# Patient Record
Sex: Male | Born: 1960 | Race: White | Hispanic: No | Marital: Married | Smoking: Former smoker
Health system: Southern US, Community
[De-identification: ages and names within clinical notes are randomized; demographics above are authoritative.]

## PROBLEM LIST (undated history)

## (undated) DIAGNOSIS — N2889 Other specified disorders of kidney and ureter: Secondary | ICD-10-CM

## (undated) DIAGNOSIS — C679 Malignant neoplasm of bladder, unspecified: Secondary | ICD-10-CM

## (undated) HISTORY — DX: Malignant neoplasm of bladder, unspecified: C67.9

## (undated) HISTORY — DX: Other specified disorders of kidney and ureter: N28.89

---
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
## 2014-12-11 HISTORY — PX: COLONOSCOPY: SHX174

## 2017-12-11 HISTORY — PX: BLADDER TUMOR EXCISION: SHX238

## 2018-09-25 NOTE — Progress Notes (Signed)
Christopher Salinas DOB: [birth year] Encounter date: 09/26/2018  This is a 57 y.o. male who presents to establish care. Chief Complaint  Patient presents with  . New Patient (Initial Visit)    chronic cough, x 1 year, worsened over the past 2 weeks, dry cough,     History of present illness: Trying to get out of present county for healthcare.  Had grandchildren after first of year; one got flu and him and wife ended up with cough. Has resolved somewhat, but in last few weeks has worsened. More productive cough, increased drainage.   Has had same doctor for a long time and has had bump in road with provider and just wasn't happy. Had some blood in urine in May; had been having blood in urine for a year; just occasionally light pink. Previous doc thought kidney stone. Then one morning got up and had large quantity of frank blood when he went to urinate. Tried to make it through morning Systems developer, Sunday school, meeting). Ended up getting CT scan, cystoscopy and bladder CA was discovered July 19th this year. Has to go back to specialist for eval of something on kidney that was seen on imaging on right. Had polyps removed in July; intrabladder treatment (chemo). Re scoped past weds and found some scar tissue which was removed/cauterized and has plans for repeat cystoscopy in January.  2 years ago wife had cerebral hemorrhage and spent significant time in hospital. Just more medical issues between two of them that they have been managing in last couple of years.   Cough has always been dry, but in last couple of weeks he is coughing up some phlegm. Does not feel SOB or wheezy at all. Knows that bp medication could trigger, but has not tried other medications. BP is typically in 371'G systolic. Has not had any lung imaging.   No fevers.   Past Medical History:  Diagnosis Date  . Bladder cancer ([place])   . Renal mass    Past Surgical History:  Procedure Laterality Date  . BLADDER TUMOR EXCISION  2019   . COLONOSCOPY  2016  . OPEN ANTERIOR SHOULDER RECONSTRUCTION Right 1980   No Known Allergies Current Meds  Medication Sig  . tadalafil (CIALIS) 5 MG tablet Take 5 mg by mouth as needed for erectile dysfunction.  . [DISCONTINUED] lisinopril-hydrochlorothiazide (PRINZIDE,ZESTORETIC) 20-12.5 MG tablet Take 1 tablet by mouth daily. 1/2 tablet daily   Social History   Tobacco Use  . Smoking status: Former Smoker    Types: Cigars, Cigarettes  . Smokeless tobacco: Never Used  Substance Use Topics  . Alcohol use: Not Currently   Family History  Problem Relation Age of Onset  . High Cholesterol Mother   . High blood pressure Mother   . Cancer Father        melanoma  . High blood pressure Father   . High blood pressure Sister   . High blood pressure Maternal Grandmother   . Kidney disease Maternal Grandmother   . Lung cancer Paternal Grandmother        smoker  . COPD Paternal Grandmother   . Lung cancer Paternal Grandfather        smoker  . High blood pressure Son      Review of Systems  Constitutional: Negative for chills, fatigue and fever.  HENT: Negative for sinus pressure (better with neti pot). Congestion: minimal.   Respiratory: Positive for cough. Negative for chest tightness, shortness of breath and wheezing.   Cardiovascular: Negative  for chest pain, palpitations and leg swelling.  Gastrointestinal:       Hemorrhoids are ongoing issue. Flare up frequently and bother him significantly.  Genitourinary: Negative for difficulty urinating, dysuria and flank pain.  Musculoskeletal: Negative for arthralgias and back pain.    Objective:  BP 130/84 (BP Location: Left Arm, Patient Position: Sitting, Cuff Size: Normal)   Pulse 79   Temp 97.9 F (36.6 C) (Oral)   Ht 6\' 1"  (1.854 m)   Wt 217 lb 11.2 oz (98.7 kg)   SpO2 97%   BMI 28.72 kg/m   Weight: 217 lb 11.2 oz (98.7 kg)   BP Readings from Last 3 Encounters:  09/26/18 130/84   Wt Readings from Last 3 Encounters:   09/26/18 217 lb 11.2 oz (98.7 kg)    Physical Exam  Constitutional: He is oriented to person, place, and time. He appears well-developed and well-nourished. No distress.  HENT:  Mouth/Throat: Uvula is midline. Posterior oropharyngeal edema and posterior oropharyngeal erythema present. No oropharyngeal exudate.  cobblestoning  Cardiovascular: Normal rate, regular rhythm and normal heart sounds. Exam reveals no friction rub.  No murmur heard. No lower extremity edema  Pulmonary/Chest: Effort normal. No respiratory distress. He has decreased breath sounds (slightly diffuse). He has no wheezes. He has no rales.  Abdominal: Soft. Normal appearance and bowel sounds are normal. There is no tenderness.  Neurological: He is alert and oriented to person, place, and time.  Psychiatric: His behavior is normal. Cognition and memory are normal.    Assessment/Plan: 1. Cough Will start with depo and duoneb given in office today. Tx with abx due to longevity of cough with worsened symptoms and heavy post nasal drainage. CXR due to prolonged cough. Consider more advanced imaging if cough not resolving. Suspect lisinopril may be trigger; see below. - DG Chest 2 View; Future - methylPREDNISolone acetate (DEPO-MEDROL) injection 80 mg  2. Post-nasal drainage See above. - amoxicillin-clavulanate (AUGMENTIN) 875-125 MG tablet; Take 1 tablet by mouth 2 (two) times daily.  Dispense: 20 tablet; Refill: 0  3. Smoking history  - ipratropium-albuterol (DUONEB) 0.5-2.5 (3) MG/3ML nebulizer solution 3 mL  4. Hypertension, unspecified type Stable on half dose lisinopril-hctz. Will change to losartan lower dose and encouraged to continue to monitor at home. I suspect we can control with single medication. - losartan (COZAAR) 25 MG tablet; Take 1 tablet (25 mg total) by mouth daily.  Dispense: 30 tablet; Refill: 2  5. Hemorrhoids, unspecified hemorrhoid type anusol prn; discussed limiting time on toilet;  toileting as soon as there is urge.  - Ambulatory referral to Colorectal Surgery  Return pending CXR.  Micheline Rough, MD  Record request has been signed; will review once available.

## 2018-09-26 ENCOUNTER — Ambulatory Visit (INDEPENDENT_AMBULATORY_CARE_PROVIDER_SITE_OTHER): Payer: 59

## 2018-09-26 ENCOUNTER — Encounter: Payer: Self-pay | Admitting: Family Medicine

## 2018-09-26 ENCOUNTER — Ambulatory Visit (INDEPENDENT_AMBULATORY_CARE_PROVIDER_SITE_OTHER): Payer: 59 | Admitting: Family Medicine

## 2018-09-26 VITALS — BP 130/84 | HR 79 | Temp 97.9°F | Ht 73.0 in | Wt 217.7 lb

## 2018-09-26 DIAGNOSIS — Z87891 Personal history of nicotine dependence: Secondary | ICD-10-CM

## 2018-09-26 DIAGNOSIS — R05 Cough: Secondary | ICD-10-CM | POA: Diagnosis not present

## 2018-09-26 DIAGNOSIS — I10 Essential (primary) hypertension: Secondary | ICD-10-CM

## 2018-09-26 DIAGNOSIS — R059 Cough, unspecified: Secondary | ICD-10-CM

## 2018-09-26 DIAGNOSIS — R0982 Postnasal drip: Secondary | ICD-10-CM

## 2018-09-26 DIAGNOSIS — K649 Unspecified hemorrhoids: Secondary | ICD-10-CM

## 2018-09-26 MED ORDER — LOSARTAN POTASSIUM-HCTZ 50-12.5 MG PO TABS: 1.0000 | ORAL_TABLET | Freq: Every day | ORAL | 3 refills | Status: DC

## 2018-09-26 MED ORDER — METHYLPREDNISOLONE ACETATE 80 MG/ML IJ SUSP
80.0000 mg | Freq: Once | INTRAMUSCULAR | Status: AC
  Administered 2018-09-26: 80 mg via INTRAMUSCULAR

## 2018-09-26 MED ORDER — IPRATROPIUM-ALBUTEROL 0.5-2.5 (3) MG/3ML IN SOLN
3.0000 mL | Freq: Once | RESPIRATORY_TRACT | Status: AC
  Administered 2018-09-26: 3 mL via RESPIRATORY_TRACT

## 2018-09-26 MED ORDER — HYDROCORTISONE 2.5 % RE CREA: 1.0000 "application " | TOPICAL_CREAM | Freq: Two times a day (BID) | RECTAL | 2 refills | Status: DC

## 2018-09-26 MED ORDER — AMOXICILLIN-POT CLAVULANATE 875-125 MG PO TABS: 1.0000 | ORAL_TABLET | Freq: Two times a day (BID) | ORAL | 0 refills | Status: DC

## 2018-09-26 MED ORDER — LOSARTAN POTASSIUM 25 MG PO TABS: 25.0000 mg | ORAL_TABLET | Freq: Every day | ORAL | 2 refills | Status: DC

## 2018-09-26 NOTE — Patient Instructions (Signed)
Chest xray when you are able.   Let me know if any worsening of symptoms or if cough is not resolved in 2 weeks time.

## 2018-09-27 ENCOUNTER — Encounter: Payer: Self-pay | Admitting: Family Medicine

## 2018-10-10 ENCOUNTER — Telehealth: Payer: Self-pay | Admitting: *Deleted

## 2018-10-10 NOTE — Telephone Encounter (Signed)
Copied from [place] [id number]. Topic: General - Other >> Oct 10, 2018 11:06 AM Oneta Rack wrote: Relation to pt: self  Call back [phone] Pharmacy: [facility], [address] [phone] (Phone) [phone] (Fax)    Reason for call:  Patient was last seen 09/26/18 by Dr. Micheline Rough as per patient AVS "Let me know if any worsening of symptoms or if cough is not resolved in 2 weeks time" patient states cough improved slightly but not 100% seeking clinical advice. Patient also wanted to inform you he scheduled his physical for 12/16/17 with PCP

## 2018-10-10 NOTE — Telephone Encounter (Signed)
Please get description of cough - productive? Hacking? Any SOB/wheezing? Make sure he started on the losartan and stopped lisinopril?  I feel reassured that xray looked good when he had that and that cough has improved somewhat. I'll let him know what to do next pending response to above. Also; remind him (he might have already) to remind specialists to [place] me on notes when he sees them.

## 2018-10-11 MED ORDER — ALBUTEROL SULFATE HFA 108 (90 BASE) MCG/ACT IN AERS: INHALATION_SPRAY | RESPIRATORY_TRACT | 0 refills | Status: DC

## 2018-10-11 MED ORDER — SPACER/AERO-HOLDING CHAMBERS DEVI: 0 refills | Status: DC

## 2018-10-11 NOTE — Telephone Encounter (Signed)
Patient has been given notes per Dr. Ethlyn Gallery, stated that he has to sign a release form for specialist to send [place]  Results. He will do this Monday.

## 2018-10-11 NOTE — Telephone Encounter (Signed)
Cough is productive, Hacking, with no SOB or wheezing.  Patient has started losartan and stopped lisinopril. Reminded to have specialist CC results to Dr. Ethlyn Gallery.

## 2018-10-11 NOTE — Telephone Encounter (Signed)
Please ask him to take daily antihistamine (claritin or zyrtec) and please send in albuterol inhaler for him to use BID as well as spacing chamber. Have him give me update if any worsening of cough or if not improved in 2 weeks. I am hoping things will be cleared up at that point. If no improvement at all in 1 week contact me.

## 2018-11-10 HISTORY — PX: HEMORRHOID BANDING: SHX5850

## 2018-12-16 ENCOUNTER — Encounter: Payer: Self-pay | Admitting: Family Medicine

## 2018-12-16 ENCOUNTER — Ambulatory Visit (INDEPENDENT_AMBULATORY_CARE_PROVIDER_SITE_OTHER): Payer: 59 | Admitting: Family Medicine

## 2018-12-16 VITALS — BP 150/80 | HR 80 | Temp 98.0°F | Wt 217.6 lb

## 2018-12-16 DIAGNOSIS — D489 Neoplasm of uncertain behavior, unspecified: Secondary | ICD-10-CM

## 2018-12-16 DIAGNOSIS — Z13220 Encounter for screening for lipoid disorders: Secondary | ICD-10-CM | POA: Diagnosis not present

## 2018-12-16 DIAGNOSIS — I10 Essential (primary) hypertension: Secondary | ICD-10-CM

## 2018-12-16 DIAGNOSIS — Z23 Encounter for immunization: Secondary | ICD-10-CM

## 2018-12-16 DIAGNOSIS — Z0001 Encounter for general adult medical examination with abnormal findings: Secondary | ICD-10-CM | POA: Diagnosis not present

## 2018-12-16 DIAGNOSIS — R5383 Other fatigue: Secondary | ICD-10-CM

## 2018-12-16 DIAGNOSIS — Z0000 Encounter for general adult medical examination without abnormal findings: Secondary | ICD-10-CM

## 2018-12-16 LAB — LIPID PANEL
Cholesterol: 232 mg/dL — ABNORMAL HIGH (ref 0–200)
HDL: 35 mg/dL — ABNORMAL LOW (ref >39.00)
LDL Cholesterol: 164 mg/dL — ABNORMAL HIGH (ref 0–99)
NonHDL: 196.63
Total CHOL/HDL Ratio: 7
Triglycerides: 165 mg/dL — ABNORMAL HIGH (ref 0.0–149.0)
VLDL: 33 mg/dL (ref 0.0–40.0)

## 2018-12-16 LAB — CBC WITH DIFFERENTIAL/PLATELET
Basophils Absolute: 0 10*3/uL (ref 0.0–0.1)
Basophils Relative: 0.7 % (ref 0.0–3.0)
EOS ABS: 0.1 10*3/uL (ref 0.0–0.7)
Eosinophils Relative: 1.6 % (ref 0.0–5.0)
HCT: 43.8 % (ref 39.0–52.0)
HEMOGLOBIN: 15.2 g/dL (ref 13.0–17.0)
Lymphocytes Relative: 22.2 % (ref 12.0–46.0)
Lymphs Abs: 1.4 10*3/uL (ref 0.7–4.0)
MCHC: 34.6 g/dL (ref 30.0–36.0)
MCV: 87 fl (ref 78.0–100.0)
MONO ABS: 0.6 10*3/uL (ref 0.1–1.0)
Monocytes Relative: 9.6 % (ref 3.0–12.0)
Neutro Abs: 4.1 10*3/uL (ref 1.4–7.7)
Neutrophils Relative %: 65.9 % (ref 43.0–77.0)
Platelets: 268 10*3/uL (ref 150.0–400.0)
RBC: 5.03 Mil/uL (ref 4.22–5.81)
RDW: 12.3 % (ref 11.5–15.5)
WBC: 6.3 10*3/uL (ref 4.0–10.5)

## 2018-12-16 LAB — COMPREHENSIVE METABOLIC PANEL
ALT: 19 U/L (ref 0–53)
AST: 16 U/L (ref 0–37)
Albumin: 4.4 g/dL (ref 3.5–5.2)
Alkaline Phosphatase: 83 U/L (ref 39–117)
BUN: 13 mg/dL (ref 6–23)
CO2: 28 mEq/L (ref 19–32)
CREATININE: 0.88 mg/dL (ref 0.40–1.50)
Calcium: 9.6 mg/dL (ref 8.4–10.5)
Chloride: 102 mEq/L (ref 96–112)
GFR: 95.42 mL/min (ref >60.00)
Glucose, Bld: 103 mg/dL — ABNORMAL HIGH (ref 70–99)
Potassium: 4.4 mEq/L (ref 3.5–5.1)
Sodium: 138 mEq/L (ref 135–145)
Total Bilirubin: 0.5 mg/dL (ref 0.2–1.2)
Total Protein: 6.8 g/dL (ref 6.0–8.3)

## 2018-12-16 LAB — TSH: TSH: 0.44 u[IU]/mL (ref 0.35–4.50)

## 2018-12-16 MED ORDER — AMLODIPINE BESYLATE 2.5 MG PO TABS: 2.5000 mg | ORAL_TABLET | Freq: Every day | ORAL | 2 refills | Status: DC

## 2018-12-16 NOTE — Patient Instructions (Addendum)
Stop losartan. Start amlodpine - ok to try half tablet daily initially.   I will call you with lab and pathology results when I get them.

## 2018-12-16 NOTE — Progress Notes (Signed)
Christopher Salinas DOB: 1961-05-12 Encounter date: 12/16/2018  This is a 58 y.o. male who presents for complete physical   History of present illness/Additional concerns: ?Tdap? Not done in last 10 years to his recollection. Traveling out of country in Feb to [place].  Flu: doesn't take flu shot.  Colonoscopy last completed 2-3 years ago. It was clear. We do not have these records.  Shingrix; hasn't had this yet.  At last visit changed lisinopril -hctz to losartan 25mg . We also discussed prolonged cough. Seemed like it was getting better for a few weeks, but then started to worsen. Feels like something is crawling in back of throat. Had vomiting illness and cough actually went away for a few days with vomiting. Not feeling acid reflux symptoms, but wonders if this is related. Occasionally productive. Has been in dusty/dirty environments; working on house, works in [employer] shop so wonders if this contributes.  Had hemorrhoid banding in December. Supposed to have f/u with urology in Jan for repeat cystoscopy with renal US prior to visit. Appointment is Jan 15th with them.   ?Routine bloodwork? Haven't received previous PCP records yet. Not been doing as well as he should taking care of self. Has tried to take cholesterol medications a few times, but it made him feel badly. Doesn't recall names of medications.   Sees eye doc on regular basis; getting new glasses this week.  Sees dentist regularly.  Past Medical History:  Diagnosis Date  . Bladder cancer ([place])   . Renal mass    Past Surgical History:  Procedure Laterality Date  . BLADDER TUMOR EXCISION  2019  . COLONOSCOPY  2016  . OPEN ANTERIOR SHOULDER RECONSTRUCTION Right 1980   No Known Allergies Current Meds  Medication Sig  . albuterol (PROVENTIL HFA;VENTOLIN HFA) 108 (90 Base) MCG/ACT inhaler Inhale 2 puffs into the lungs every 6 hours as needed.  Marland Kitchen amoxicillin-clavulanate (AUGMENTIN) 875-125 MG tablet Take 1 tablet by  mouth 2 (two) times daily.  . hydrocortisone (ANUSOL-HC) 2.5 % rectal cream Place 1 application rectally 2 (two) times daily.  Marland Kitchen Spacer/Aero-Holding Chambers DEVI Inhale 2 puffs into the lungs every 6 hours as needed  . tadalafil (CIALIS) 5 MG tablet Take 5 mg by mouth as needed for erectile dysfunction.  . [DISCONTINUED] losartan (COZAAR) 25 MG tablet Take 1 tablet (25 mg total) by mouth daily.   Social History   Tobacco Use  . Smoking status: Former Smoker    Types: Cigars, Cigarettes  . Smokeless tobacco: Never Used  Substance Use Topics  . Alcohol use: Not Currently   Family History  Problem Relation Age of Onset  . High Cholesterol Mother   . High blood pressure Mother   . Cancer Father        melanoma  . High blood pressure Father   . High blood pressure Sister   . High blood pressure Maternal Grandmother   . Kidney disease Maternal Grandmother   . Lung cancer Paternal Grandmother        smoker  . COPD Paternal Grandmother   . Lung cancer Paternal Grandfather        smoker  . High blood pressure Son      Review of Systems  Constitutional: Negative for activity change, appetite change, chills, fatigue, fever and unexpected weight change.  HENT: Negative for congestion, ear pain, hearing loss, postnasal drip, rhinorrhea, sinus pressure, sinus pain, sore throat and trouble swallowing.   Eyes: Negative for pain and visual  disturbance.  Respiratory: Positive for cough. Negative for chest tightness, shortness of breath and wheezing.   Cardiovascular: Negative for chest pain, palpitations and leg swelling.  Gastrointestinal: Negative for abdominal distention, abdominal pain, blood in stool, constipation, diarrhea, nausea and vomiting.  Genitourinary: Negative for decreased urine volume, difficulty urinating, dysuria, penile pain and testicular pain.  Musculoskeletal: Negative for arthralgias, back pain and joint swelling.  Skin: Negative for rash.       Skin lesion right  cheek, itchy, present for a year. Thinks enlarging.   Neurological: Negative for dizziness, weakness, numbness and headaches.  Hematological: Negative for adenopathy. Does not bruise/bleed easily.  Psychiatric/Behavioral: Negative for agitation, sleep disturbance and suicidal ideas. The patient is not nervous/anxious.     CBC: No results found for: WBC, HGB, HCT, MCH, MCHC, RDW, PLT, MPV CMP:No results found for: NA, K, CL, CO2, ANIONGAP, GLUCOSE, BUN, CREATININE, LABGLOB, GFRAA, CALCIUM, PROT, AGRATIO, BILITOT, ALKPHOS, ALT, AST, GLOB LIPID:No results found for: CHOL, TRIG, HDL, LDLCALC, LABVLDL  Objective:  BP (!) 150/80 (BP Location: Left Arm, Patient Position: Sitting, Cuff Size: Normal)   Pulse 80   Temp 98 F (36.7 C) (Oral)   Wt 217 lb 9.6 oz (98.7 kg)   SpO2 97%   BMI 28.71 kg/m   Weight: 217 lb 9.6 oz (98.7 kg)   BP Readings from Last 3 Encounters:  12/16/18 (!) 150/80  09/26/18 130/84   Wt Readings from Last 3 Encounters:  12/16/18 217 lb 9.6 oz (98.7 kg)  09/26/18 217 lb 11.2 oz (98.7 kg)    Physical Exam Constitutional:      General: He is not in acute distress.    Appearance: He is well-developed.  HENT:     Head: Normocephalic and atraumatic.     Right Ear: External ear normal.     Left Ear: External ear normal.     Nose: Nose normal.     Mouth/Throat:     Pharynx: No oropharyngeal exudate.  Eyes:     Conjunctiva/sclera: Conjunctivae normal.     Pupils: Pupils are equal, round, and reactive to light.  Neck:     Musculoskeletal: Neck supple.     Thyroid: No thyromegaly.  Cardiovascular:     Rate and Rhythm: Normal rate and regular rhythm.     Heart sounds: Normal heart sounds. No murmur. No friction rub. No gallop.   Pulmonary:     Effort: Pulmonary effort is normal. No respiratory distress.     Breath sounds: Normal breath sounds. No stridor. No wheezing or rales.  Abdominal:     General: Bowel sounds are normal.     Palpations: Abdomen is soft.   Musculoskeletal: Normal range of motion.  Skin:    General: Skin is warm and dry.     Comments: 2 adjacent 45mm fleshy papular lesion right cheek with some surrounding scaling. Some slight increased vascularity underlying. Entire area approx 50mm.   Neurological:     Mental Status: He is alert and oriented to person, place, and time.  Psychiatric:        Behavior: Behavior normal.        Thought Content: Thought content normal.        Judgment: Judgment normal.    Shave Biopsy Procedure Note  Pre-operative Diagnosis: Suspicious lesion  Post-operative Diagnosis: same  Locations: fleshy papular lesion approx 63mm in diameter  Anesthesia: Lidocaine 1% with epinephrine   Procedure Details  Patient informed of the risks, including bleeding and infection, and benefits  of the  procedure and Verbal informed consent obtained. The lesion and surrounding area was given a sterile prep using chloraprep and draped in the usual sterile fashion. The skin lesion was shaved superficially using a dermablade.  Hemostasis of biopsy site was achieved using aluminum chloride. Sterile dressing applied. The specimen was sent for pathologic examination. The patient tolerated the procedure well.  Complications: none.  Plan: 1. Instructed to keep the wound dry and covered for 24 hrs and clean thereafter with soap and water.  But no chlorine hot tubs or pool exposure to surgical site.  2. Warning signs of infection were reviewed.   3. Recommended that the patient use OTC analgesics as needed for pain.  4. Will contact patient with pathology results when obtained.  Micheline Rough, MD   Assessment/Plan: Health Maintenance Due  Topic Date Due  . Hepatitis C Screening  1961-05-26  . HIV Screening  07/16/1978  . TETANUS/TDAP  07/16/1982  . COLONOSCOPY  07/16/2013  . INFLUENZA VACCINE  07/11/2018   Health Maintenance reviewed.  1. Preventative health care He is up to date with age recommended  screenings. Recommend daily exercise to help with overall health.  2. Lipid screening Complete today. Not tolerated statins in past. - Lipid panel; Future - Lipid panel  3. Need for hepatitis A immunization Traveling to [place]. [place] in February. Can complete series after return. - Hepatitis A vaccine adult IM  4. Need for Tdap vaccination - Tdap vaccine greater than or equal to 7yo IM  5. Other fatigue - Comprehensive metabolic panel; Future - CBC with Differential/Platelet; Future - TSH; Future - TSH - CBC with Differential/Platelet - Comprehensive metabolic panel  6. Neoplasm, uncertain whether benign or malignant Send for pathology; derm referral pending result. He prefers not to see another specialist if possible. - PR SHAV SKIN LES < 0.5 CM TRUNK,ARM,LEG - Dermatology pathology  7. Hypertension, unspecified type We are going to switch out the losartan to norvasc. Uncertain if continuing with cough due to losartan. Trial 1/2 tab of the norvasc (currently has only been doing 1/2 tab of losartan 25mg ) and return visit pending bloodwork/path but would like to see better bp control without side effects. If cough persists would recommend treating to cover allergy/reflux and monitoring for response. He has no active allergy/reflux sx and prefers to keep medications to minimum which is why we are trying new bp medication first. - amLODipine (NORVASC) 2.5 MG tablet; Take 1 tablet (2.5 mg total) by mouth daily.  Dispense: 30 tablet; Refill: 2   Return pending bloodwork.  Micheline Rough, MD

## 2018-12-17 ENCOUNTER — Other Ambulatory Visit (INDEPENDENT_AMBULATORY_CARE_PROVIDER_SITE_OTHER): Payer: 59

## 2018-12-17 DIAGNOSIS — R739 Hyperglycemia, unspecified: Secondary | ICD-10-CM

## 2018-12-17 LAB — HEMOGLOBIN A1C: Hgb A1c MFr Bld: 5.4 % (ref 4.6–6.5)

## 2018-12-18 ENCOUNTER — Encounter: Payer: Self-pay | Admitting: Family Medicine

## 2018-12-18 NOTE — Telephone Encounter (Addendum)
I see that he has had 1 Hep A vaccine on 12/16/18 Please advise

## 2019-01-16 ENCOUNTER — Other Ambulatory Visit: Payer: Self-pay | Admitting: Family Medicine

## 2019-01-16 ENCOUNTER — Encounter: Payer: Self-pay | Admitting: Family Medicine

## 2019-01-16 NOTE — Telephone Encounter (Signed)
Please advise. Does patient need a appointment?

## 2019-01-17 ENCOUNTER — Other Ambulatory Visit: Payer: Self-pay | Admitting: Family Medicine

## 2019-01-17 DIAGNOSIS — I10 Essential (primary) hypertension: Secondary | ICD-10-CM

## 2019-01-17 MED ORDER — AMLODIPINE BESYLATE 2.5 MG PO TABS: 2.5000 mg | ORAL_TABLET | Freq: Every day | ORAL | 3 refills | Status: DC

## 2019-01-17 MED ORDER — TADALAFIL 5 MG PO TABS: 5.0000 mg | ORAL_TABLET | Freq: Every day | ORAL | 5 refills | Status: DC | PRN

## 2019-03-10 ENCOUNTER — Encounter: Payer: Self-pay | Admitting: Family Medicine

## 2019-04-17 ENCOUNTER — Encounter: Payer: Self-pay | Admitting: Family Medicine

## 2019-05-21 ENCOUNTER — Encounter: Payer: Self-pay | Admitting: Family Medicine

## 2019-11-07 IMAGING — DX DG CHEST 2V
2 series · 2 of 2 positions shown · non-contrast
Comparison: None

CLINICAL DATA: Cough since [REDACTED], keeping patient up at night,
minimally productive of clear material, former smoker

EXAM:
CHEST - 2 VIEW

[chest pa]
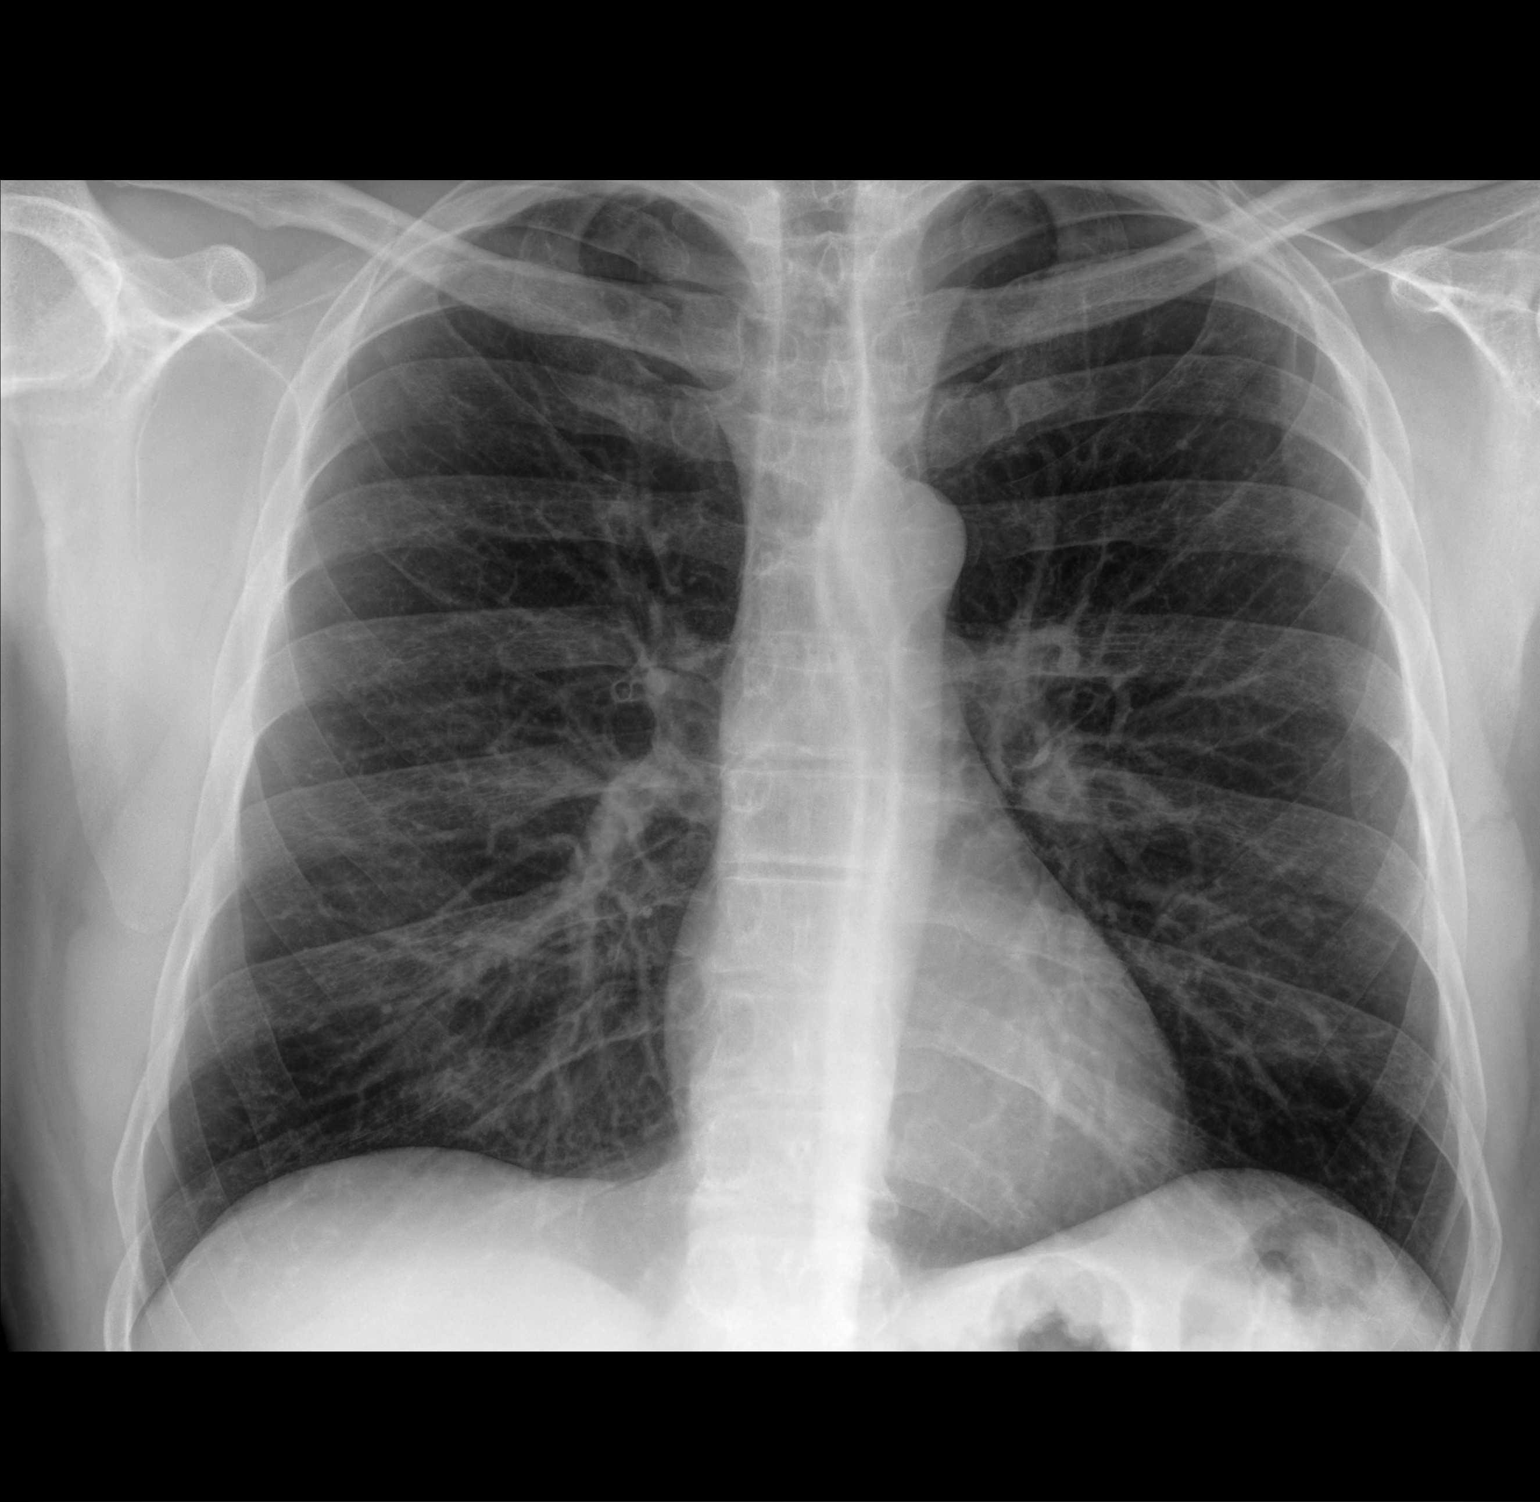

[chest lat]
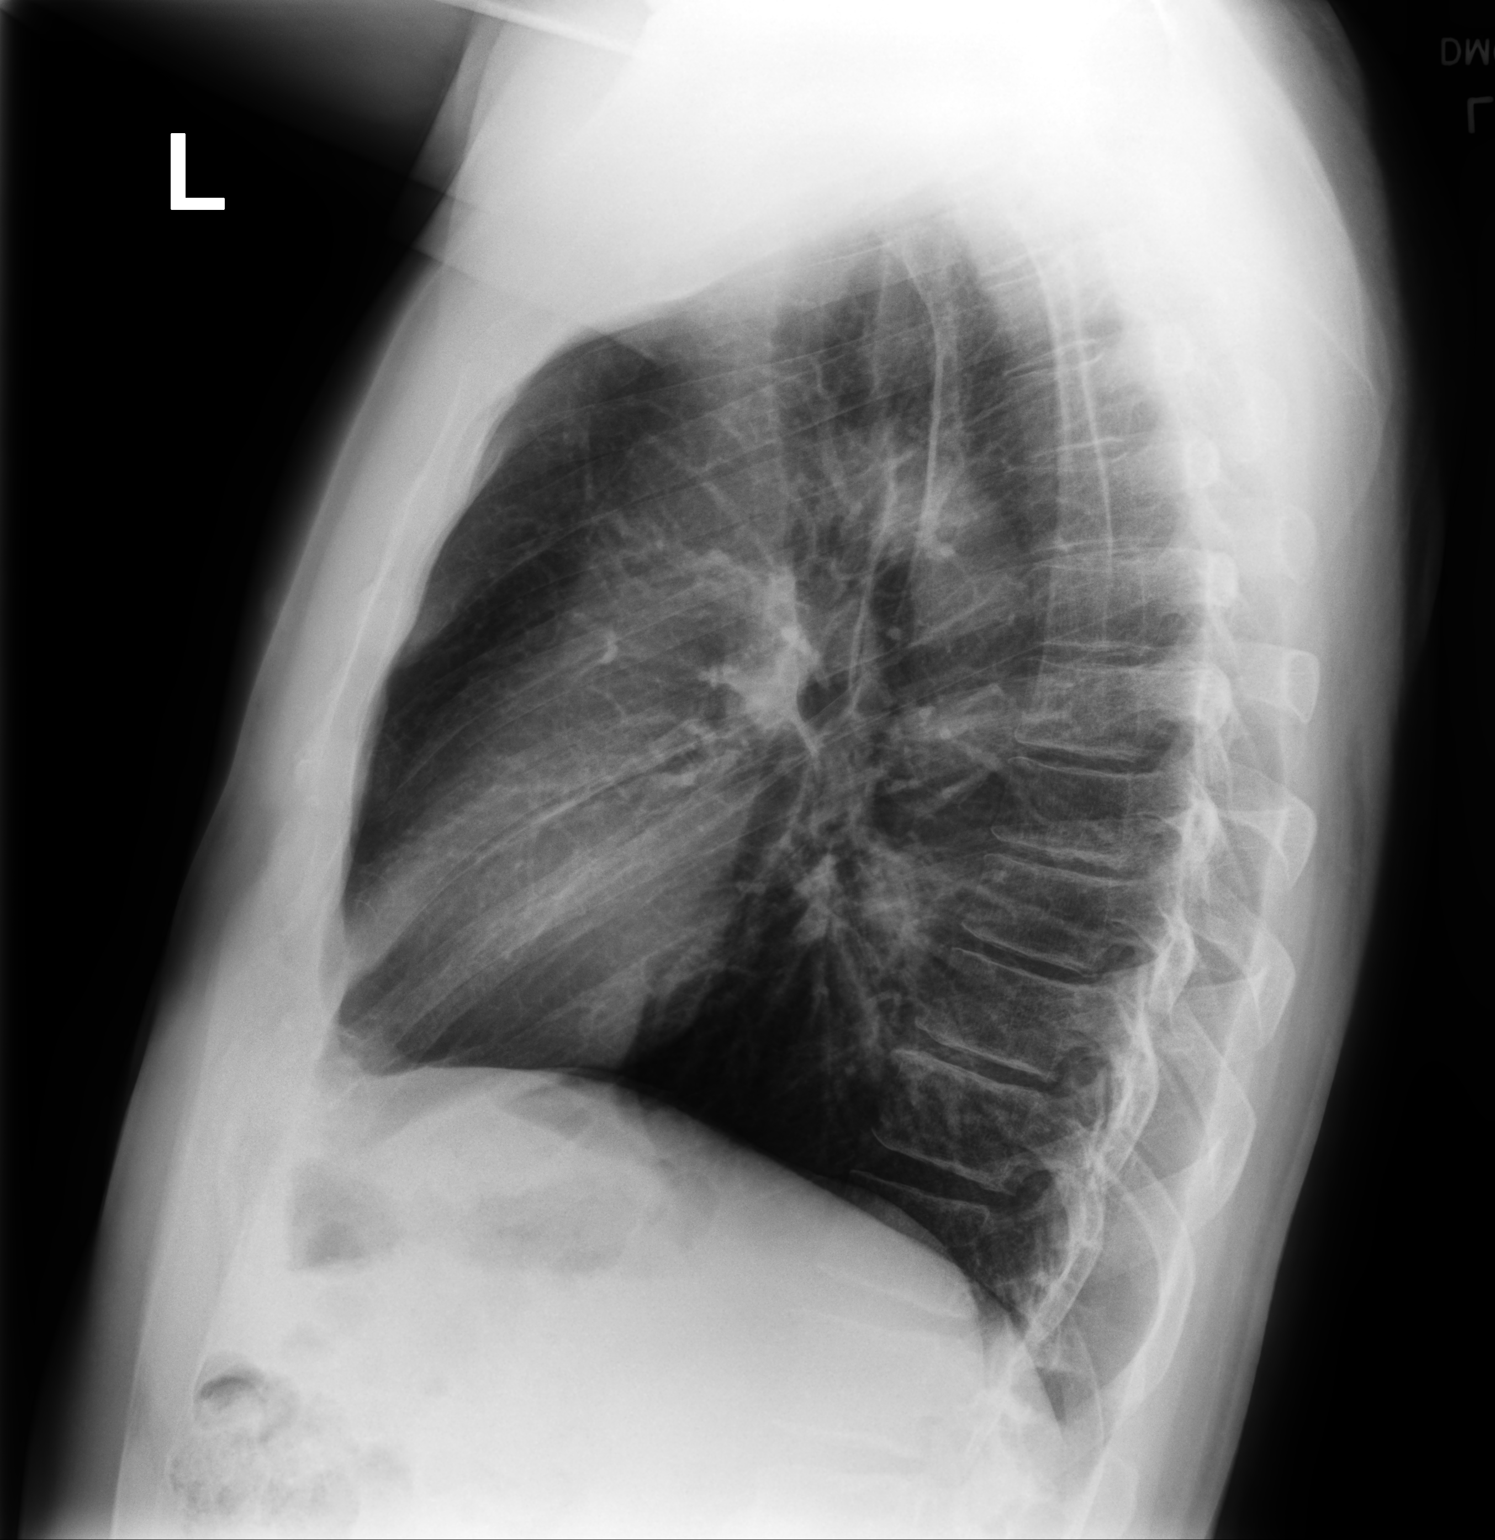

[2 of 2 positions shown; findings below may reference images not displayed]

FINDINGS: Normal heart size, mediastinal contours, and pulmonary vascularity.

Minimal central peribronchial thickening and hyperinflation.

No acute pulmonary infiltrate, pleural effusion or pneumothorax.

Bones unremarkable.
IMPRESSION: Minimal bronchitic changes and hyperinflation which could reflect
COPD or asthma.

No acute infiltrate.

## 2019-12-19 ENCOUNTER — Encounter: Payer: Self-pay | Admitting: Family Medicine

## 2019-12-24 ENCOUNTER — Encounter: Payer: Self-pay | Admitting: Family Medicine

## 2019-12-24 ENCOUNTER — Ambulatory Visit (INDEPENDENT_AMBULATORY_CARE_PROVIDER_SITE_OTHER): Payer: 59 | Admitting: Family Medicine

## 2019-12-24 ENCOUNTER — Other Ambulatory Visit: Payer: Self-pay

## 2019-12-24 VITALS — BP 150/80 | HR 91 | Temp 97.5°F | Ht 73.0 in | Wt 224.6 lb

## 2019-12-24 DIAGNOSIS — Z0000 Encounter for general adult medical examination without abnormal findings: Secondary | ICD-10-CM | POA: Diagnosis not present

## 2019-12-24 DIAGNOSIS — M2550 Pain in unspecified joint: Secondary | ICD-10-CM | POA: Diagnosis not present

## 2019-12-24 DIAGNOSIS — Z131 Encounter for screening for diabetes mellitus: Secondary | ICD-10-CM

## 2019-12-24 DIAGNOSIS — Z13220 Encounter for screening for lipoid disorders: Secondary | ICD-10-CM | POA: Diagnosis not present

## 2019-12-24 DIAGNOSIS — E049 Nontoxic goiter, unspecified: Secondary | ICD-10-CM

## 2019-12-24 DIAGNOSIS — I10 Essential (primary) hypertension: Secondary | ICD-10-CM | POA: Diagnosis not present

## 2019-12-24 DIAGNOSIS — Z8551 Personal history of malignant neoplasm of bladder: Secondary | ICD-10-CM

## 2019-12-24 DIAGNOSIS — M7711 Lateral epicondylitis, right elbow: Secondary | ICD-10-CM | POA: Diagnosis not present

## 2019-12-24 LAB — LIPID PANEL
Cholesterol: 247 mg/dL — ABNORMAL HIGH (ref 0–200)
HDL: 38.7 mg/dL — ABNORMAL LOW (ref >39.00)
LDL Cholesterol: 179 mg/dL — ABNORMAL HIGH (ref 0–99)
NonHDL: 208.67
Total CHOL/HDL Ratio: 6
Triglycerides: 148 mg/dL (ref 0.0–149.0)
VLDL: 29.6 mg/dL (ref 0.0–40.0)

## 2019-12-24 LAB — TSH: TSH: 0.64 u[IU]/mL (ref 0.35–4.50)

## 2019-12-24 LAB — CBC WITH DIFFERENTIAL/PLATELET
Basophils Absolute: 0 10*3/uL (ref 0.0–0.1)
Basophils Relative: 0.9 % (ref 0.0–3.0)
Eosinophils Absolute: 0.1 10*3/uL (ref 0.0–0.7)
Eosinophils Relative: 1.6 % (ref 0.0–5.0)
HCT: 46.9 % (ref 39.0–52.0)
Hemoglobin: 16 g/dL (ref 13.0–17.0)
Lymphocytes Relative: 14 % (ref 12.0–46.0)
Lymphs Abs: 0.7 10*3/uL (ref 0.7–4.0)
MCHC: 34 g/dL (ref 30.0–36.0)
MCV: 87 fl (ref 78.0–100.0)
Monocytes Absolute: 0.7 10*3/uL (ref 0.1–1.0)
Monocytes Relative: 14.2 % — ABNORMAL HIGH (ref 3.0–12.0)
Neutro Abs: 3.5 10*3/uL (ref 1.4–7.7)
Neutrophils Relative %: 69.3 % (ref 43.0–77.0)
Platelets: 205 10*3/uL (ref 150.0–400.0)
RBC: 5.39 Mil/uL (ref 4.22–5.81)
RDW: 12.6 % (ref 11.5–15.5)
WBC: 5.1 10*3/uL (ref 4.0–10.5)

## 2019-12-24 LAB — COMPREHENSIVE METABOLIC PANEL
ALT: 22 U/L (ref 0–53)
AST: 21 U/L (ref 0–37)
Albumin: 4.9 g/dL (ref 3.5–5.2)
Alkaline Phosphatase: 144 U/L — ABNORMAL HIGH (ref 39–117)
BUN: 18 mg/dL (ref 6–23)
CO2: 28 mEq/L (ref 19–32)
Calcium: 9.6 mg/dL (ref 8.4–10.5)
Chloride: 99 mEq/L (ref 96–112)
Creatinine, Ser: 1.1 mg/dL (ref 0.40–1.50)
GFR: 69.14 mL/min (ref >60.00)
Glucose, Bld: 96 mg/dL (ref 70–99)
Potassium: 3.7 mEq/L (ref 3.5–5.1)
Sodium: 137 mEq/L (ref 135–145)
Total Bilirubin: 0.6 mg/dL (ref 0.2–1.2)
Total Protein: 7.7 g/dL (ref 6.0–8.3)

## 2019-12-24 LAB — C-REACTIVE PROTEIN: CRP: <1 mg/dL (ref 0.5–20.0)

## 2019-12-24 LAB — SEDIMENTATION RATE: Sed Rate: 16 mm/hr (ref 0–20)

## 2019-12-24 LAB — HEMOGLOBIN A1C: Hgb A1c MFr Bld: 5.5 % (ref 4.6–6.5)

## 2019-12-24 MED ORDER — AMLODIPINE BESYLATE 5 MG PO TABS: 5.0000 mg | ORAL_TABLET | Freq: Every day | ORAL | 1 refills | Status: DC

## 2019-12-24 NOTE — Progress Notes (Signed)
Christopher Salinas DOB: 1961-08-19 Encounter date: 12/24/2019  This is a 59 y.o. male who presents for complete physical   History of present illness/Additional concerns:  HTN: Does check bp at home. No longer having cough with the amlodipine. Has to sit for a little while to get blood pressure into nicer range. If doing any activity bp is running a little higher. Hasn't noted a low pressure.  Hx of bladder cancer/Renal mass: Last urology note stated a repeat cystoscopy will be done in January 2020, but I do not see any further notes after this.  There are also plans to get a renal ultrasound due to a lesion noted in the left kidney.  Last ultrasound was in October/2019 and revealed a stable small left renal cyst.  Was supposed to have US done again on kidney, but this was cancelled and not rescheduled. Was supposed to be done at [facility].   Patient states that every time he goes back for cystoscopy there are more polyps. Doc discussed installations in bladder if there is not improvement. Last visit was August/Sept and follows up next month. He is concerned with potential side effects of treatment. He was put on medication at last visit - couldn't sleep - felt throbbing in hands and felt like there were needles going into fingers. Just now feels like he is recovering from this and just wants to make sure that he isn't put on anything that makes more issues for him.   Past Medical History:  Diagnosis Date  . Bladder cancer ([place])   . Renal mass    Past Surgical History:  Procedure Laterality Date  . BLADDER TUMOR EXCISION  2019  . COLONOSCOPY  2016  . HEMORRHOID BANDING  11/2018  . OPEN ANTERIOR SHOULDER RECONSTRUCTION Right 1980   No Known Allergies Current Meds  Medication Sig  . albuterol (PROVENTIL HFA;VENTOLIN HFA) 108 (90 Base) MCG/ACT inhaler Inhale 2 puffs into the lungs every 6 hours as needed.  . hydrocortisone (ANUSOL-HC) 2.5 % rectal cream Place 1 application  rectally 2 (two) times daily.  Marland Kitchen Spacer/Aero-Holding Chambers DEVI Inhale 2 puffs into the lungs every 6 hours as needed  . tadalafil (CIALIS) 5 MG tablet Take 1 tablet (5 mg total) by mouth daily as needed for erectile dysfunction.  . [DISCONTINUED] amLODipine (NORVASC) 2.5 MG tablet Take 1 tablet (2.5 mg total) by mouth daily.   Social History   Tobacco Use  . Smoking status: Former Smoker    Types: Cigars, Cigarettes  . Smokeless tobacco: Never Used  Substance Use Topics  . Alcohol use: Not Currently   Family History  Problem Relation Age of Onset  . High Cholesterol Mother   . High blood pressure Mother   . Cancer Father        melanoma  . High blood pressure Father   . High blood pressure Sister   . High blood pressure Maternal Grandmother   . Kidney disease Maternal Grandmother   . Lung cancer Paternal Grandmother        smoker  . COPD Paternal Grandmother   . Lung cancer Paternal Grandfather        smoker  . High blood pressure Son      Review of Systems  Constitutional: Negative for activity change, appetite change, chills, fatigue, fever and unexpected weight change.  HENT: Negative for congestion, ear pain, hearing loss, sinus pressure, sinus pain, sore throat and trouble swallowing.   Eyes: Negative for pain and visual disturbance.  Respiratory: Negative for cough, chest tightness, shortness of breath and wheezing.   Cardiovascular: Negative for chest pain, palpitations and leg swelling.  Gastrointestinal: Negative for abdominal distention, abdominal pain, blood in stool, constipation, diarrhea, nausea and vomiting.       Has had a few episodes of food sticking with swallowing.  Genitourinary: Negative for decreased urine volume, difficulty urinating, dysuria, penile pain and testicular pain.  Musculoskeletal: Positive for arthralgias and neck pain. Negative for back pain and joint swelling.       Stepped wrong between ramps when loading law mower on to right  knee. Really hurt a couple of weeks ago, and better now, but aches every once in awhile. Shoulders bother him, esp left. Right elbow bothering him. No known injury.   Skin: Negative for rash.  Neurological: Negative for dizziness, weakness, numbness and headaches.  Hematological: Negative for adenopathy. Does not bruise/bleed easily.  Psychiatric/Behavioral: Negative for agitation, sleep disturbance and suicidal ideas. The patient is not nervous/anxious.     CBC:  Lab Results  Component Value Date   WBC 6.3 12/16/2018   HGB 15.2 12/16/2018   HCT 43.8 12/16/2018   MCHC 34.6 12/16/2018   RDW 12.3 12/16/2018   PLT 268.0 12/16/2018   CMP: Lab Results  Component Value Date   NA 138 12/16/2018   K 4.4 12/16/2018   CL 102 12/16/2018   CO2 28 12/16/2018   GLUCOSE 103 (H) 12/16/2018   BUN 13 12/16/2018   CREATININE 0.88 12/16/2018   CALCIUM 9.6 12/16/2018   PROT 6.8 12/16/2018   BILITOT 0.5 12/16/2018   ALKPHOS 83 12/16/2018   ALT 19 12/16/2018   AST 16 12/16/2018   LIPID: Lab Results  Component Value Date   CHOL 232 (H) 12/16/2018   TRIG 165.0 (H) 12/16/2018   HDL 35.00 (L) 12/16/2018   LDLCALC 164 (H) 12/16/2018    Objective:  BP (!) 150/80 (BP Location: Left Arm, Patient Position: Sitting, Cuff Size: Large)   Pulse 91   Temp (!) 97.5 F (36.4 C) (Temporal)   Ht 6\' 1"  (1.854 m)   Wt 224 lb 9.6 oz (101.9 kg)   SpO2 98%   BMI 29.63 kg/m   Weight: 224 lb 9.6 oz (101.9 kg)   BP Readings from Last 3 Encounters:  12/24/19 (!) 150/80  12/16/18 (!) 150/80  09/26/18 130/84   Wt Readings from Last 3 Encounters:  12/24/19 224 lb 9.6 oz (101.9 kg)  12/16/18 217 lb 9.6 oz (98.7 kg)  09/26/18 217 lb 11.2 oz (98.7 kg)    Physical Exam Constitutional:      General: He is not in acute distress.    Appearance: He is well-developed.  HENT:     Head: Normocephalic and atraumatic.     Right Ear: External ear normal.     Left Ear: External ear normal.     Nose: Nose  normal.     Mouth/Throat:     Pharynx: No oropharyngeal exudate.  Eyes:     Conjunctiva/sclera: Conjunctivae normal.     Pupils: Pupils are equal, round, and reactive to light.  Neck:     Thyroid: Thyromegaly present. No thyroid mass or thyroid tenderness.  Cardiovascular:     Rate and Rhythm: Normal rate and regular rhythm.     Heart sounds: Normal heart sounds. No murmur. No friction rub. No gallop.   Pulmonary:     Effort: Pulmonary effort is normal. No respiratory distress.     Breath sounds: Normal breath  sounds. No stridor. No wheezing or rales.  Abdominal:     General: Bowel sounds are normal.     Palpations: Abdomen is soft.  Musculoskeletal:        General: Normal range of motion.     Cervical back: Neck supple.     Comments: Right lateral epicondyle tenderness  Lymphadenopathy:     Cervical: No cervical adenopathy.  Skin:    General: Skin is warm and dry.     Comments: Multiple skin tags axilla and neck  Neurological:     Mental Status: He is alert and oriented to person, place, and time.  Psychiatric:        Behavior: Behavior normal.        Thought Content: Thought content normal.        Judgment: Judgment normal.     Assessment/Plan: There are no preventive care reminders to display for this patient. Health Maintenance reviewed.  1. Preventative health care He is planning on eating healthier and starting walking and lifting weights with his wife.  He would like to cut out soda and sweets.  2. Essential hypertension Suboptimal control.  Increase amlodipine to 5 mg daily. - CBC with Differential/Platelet; Future - Comprehensive metabolic panel; Future - amLODipine (NORVASC) 5 MG tablet; Take 1 tablet (5 mg total) by mouth daily.  Dispense: 90 tablet; Refill: 1 - CBC with Differential/Platelet - Comprehensive metabolic panel  3. Right tennis elbow Instructed to wear tennis elbow band and information given for stretches to do.  Try to avoid using arm for  lifting for a couple of weeks.  If this is not improving, let me know.  4. Lipid screening - Lipid panel; Future - Lipid panel  5. Screening for diabetes mellitus - Hemoglobin A1c; Future - Hemoglobin A1c  6. Enlarged thyroid - TSH; Future - TSH  7. Arthralgia, unspecified joint - C-reactive protein; Future - Sedimentation rate; Future - Sedimentation rate - C-reactive protein  8. Bladder cancer history: He is following regularly with urology.  Has upcoming appointment.  I have told him that I will review their recommendations and get back in touch with him after this upcoming visit.  9. Dysphagia: Chew slowly and be careful with foods that tend to have more risk of sticking.  If this continues to be a problem, he will need an EGD.  Thyroid did feel somewhat enlarged today, we will check some blood work and could consider ultrasound for evaluation as well.  Do not suspect the thyroid is the cause of food sticking since this is only occurred on a handful of occasions.  Return in about 6 months (around 06/22/2020) for Chronic condition visit (and possible skin tag removal; 30 min please).  Micheline Rough, MD

## 2019-12-24 NOTE — Patient Instructions (Addendum)
Send me a mychart message if you don't hear from me within 1 week of urology visit.  Tennis Elbow  Tennis elbow (lateral epicondylitis) is inflammation of tendons in your outer forearm, near your elbow. Tendons are tissues that connect muscle to bone. When you have tennis elbow, inflammation affects the tendons that you use to bend your wrist and move your hand up. Inflammation occurs in the lower part of the upper arm bone (humerus), where the tendons connect to the bone (lateral epicondyle). Tennis elbow often affects people who play tennis, but anyone may get the condition from repeatedly extending the wrist or turning the forearm. What are the causes? This condition is usually caused by repeatedly extending the wrist, turning the forearm, and using the hands. It can result from sports or work that requires repetitive forearm movements. In some cases, it may be caused by a sudden injury. What increases the risk? You are more likely to develop tennis elbow if you play tennis or another racket sport. You also have a higher risk if you frequently use your hands for work. Besides people who play tennis, others at greater risk include:  Musicians.  Carpenters, painters, and plumbers.  Cooks.  Cashiers.  People who work in [employer].  Architect workers.  Butchers.  People who use computers. What are the signs or symptoms? Symptoms of this condition include:  Pain and tenderness in the forearm and the outer part of the elbow. Pain may be felt only when using the arm, or it may be there all the time.  A burning feeling that starts in the elbow and spreads down the forearm.  A weak grip in the hand. How is this diagnosed? This condition may be diagnosed based on:  Your symptoms and medical history.  A physical exam.  X-rays.  MRI. How is this treated? Resting and icing your arm is often the first treatment. Your health care provider may also recommend:  Medicines to reduce  pain and inflammation. These may be in the form of a pill, topical gels, or shots of a steroid medicine (cortisone).  An elbow strap to reduce stress on the area.  Physical therapy. This may include massage or exercises.  An elbow brace to restrict the movements that cause symptoms. If these treatments do not help relieve your symptoms, your health care provider may recommend surgery to remove damaged muscle and reattach healthy muscle to bone. Follow these instructions at home: Activity  Rest your elbow and wrist and avoid activities that cause symptoms, as told by your health care provider.  Do physical therapy exercises as instructed.  If you lift an object, lift it with your palm facing up. This reduces stress on your elbow. Lifestyle  If your tennis elbow is caused by sports, check your equipment and make sure that: ? You are using it correctly. ? It is the best fit for you.  If your tennis elbow is caused by work or computer use, take frequent breaks to stretch your arm. Talk with your manager about ways to manage your condition at work. If you have a brace:  Wear the brace or strap as told by your health care provider. Remove it only as told by your health care provider.  Loosen the brace if your fingers tingle, become numb, or turn cold and blue.  Keep the brace clean.  If the brace is not waterproof, ask if you may remove it for bathing. If you must keep the brace on while bathing: ?  Do not let it get wet. ? Cover it with a watertight covering when you take a bath or a shower. General instructions   If directed, put ice on the painful area: ? Put ice in a plastic bag. ? Place a towel between your skin and the bag. ? Leave the ice on for 20 minutes, 2-3 times a day.  Take over-the-counter and prescription medicines only as told by your health care provider.  Keep all follow-up visits as told by your health care provider. This is important. Contact a health care  provider if:  You have pain that gets worse or does not get better with treatment.  You have numbness or weakness in your forearm, hand, or fingers. Summary  Tennis elbow (lateral epicondylitis) is inflammation of tendons in your outer forearm, near your elbow.  Common symptoms include pain and tenderness in your forearm and the outer part of your elbow.  This condition is usually caused by repeatedly extending your wrist, turning your forearm, and using your hands.  The first treatment is often resting and icing your arm to relieve symptoms. Further treatment may include taking medicine, getting physical therapy, wearing a brace or strap, or having surgery. This information is not intended to replace advice given to you by your health care provider. Make sure you discuss any questions you have with your health care provider. Document Revised: 08/23/2018 Document Reviewed: 09/11/2017 Elsevier Patient Education  [place].

## 2019-12-27 ENCOUNTER — Other Ambulatory Visit: Payer: Self-pay | Admitting: Family Medicine

## 2019-12-27 DIAGNOSIS — R748 Abnormal levels of other serum enzymes: Secondary | ICD-10-CM

## 2020-01-02 ENCOUNTER — Encounter: Payer: Self-pay | Admitting: Family Medicine

## 2020-01-05 ENCOUNTER — Other Ambulatory Visit: Payer: Self-pay

## 2020-01-05 ENCOUNTER — Telehealth (INDEPENDENT_AMBULATORY_CARE_PROVIDER_SITE_OTHER): Payer: 59 | Admitting: Family Medicine

## 2020-01-05 ENCOUNTER — Encounter: Payer: Self-pay | Admitting: Family Medicine

## 2020-01-05 DIAGNOSIS — J019 Acute sinusitis, unspecified: Secondary | ICD-10-CM

## 2020-01-05 DIAGNOSIS — U071 COVID-19: Secondary | ICD-10-CM | POA: Diagnosis not present

## 2020-01-05 DIAGNOSIS — R05 Cough: Secondary | ICD-10-CM

## 2020-01-05 DIAGNOSIS — R059 Cough, unspecified: Secondary | ICD-10-CM

## 2020-01-05 MED ORDER — BENZONATATE 100 MG PO CAPS: 100.0000 mg | ORAL_CAPSULE | Freq: Three times a day (TID) | ORAL | 1 refills | Status: DC | PRN

## 2020-01-05 MED ORDER — DOXYCYCLINE HYCLATE 100 MG PO TABS: 100.0000 mg | ORAL_TABLET | Freq: Two times a day (BID) | ORAL | 0 refills | Status: AC

## 2020-01-05 NOTE — Telephone Encounter (Signed)
I am happy to see him as a virtual at the end of my morning if he would like so that we can discuss symptoms and perhaps offer some treatment options. Could also do a virtual with Dr. Maudie Mercury tomorrow?

## 2020-01-05 NOTE — Progress Notes (Signed)
Virtual Visit via Telephone Note  I connected with Christopher Salinas  on 01/05/20 at  1:00 PM EST by telephone and verified that I am speaking with the correct person using two identifiers.   I discussed the limitations, risks, security and privacy concerns of performing an evaluation and management service by telephone and the availability of in person appointments. I also discussed with the patient that there may be a patient responsible charge related to this service. The patient expressed understanding and agreed to proceed.  Location patient: home Location provider: work office Participants present for the call: patient, provider Patient did not have a visit in the prior 7 days to address this/these issue(s).   History of Present Illness: Exposed 1.5-2 weeks ago. MIL was there 4 days; kept coughing, nose running.   Over weekend nose running and headache. No temp (closer to normal).   Took antihistamine to help dry up nose. Monday just no energy. Tuesday felt well. Then just completely ran out. Lost taste for a couple of days.   Took mucinex last 4-5 nights before bedtime.   Since taking mucinex he gets up in morning and blows nose for 5 minutes - brown, green, yellow, bloody. Feels like he has some infection going on in sinuses.   Feels fine, coughing some. If he coughs something up it is usually white. Shortness of breath comes and goes. Every once in awhile then hits him a little harder. Just feels like he runs out of air. First part of week would be moments where he couldn't get breath back, but this feels better now. Sometimes hurts to breathe for a second when you run out of air, but this feels better.   Having trouble pulling things out of his brain. Feels like memory is foggy.   Feels great now. Just worrying about sinuses at this point.  Feels the drainage from sinuses is excessive, purulent, bloody.   Observations/Objective: Patient sounds cheerful and well on the  phone. I do not appreciate any SOB. Speech and thought processing are grossly intact. Patient reported vitals:BP 132/87 HR 89 218lb 96.7 temp  Assessment and Plan: 1. Cough Okay to continue over-the-counter treatments for cough (Mucinex, Robitussin).  Albuterol as needed suggested using 1 hour prior to bed to help with breathing through the evening.  Suggested pulse ox if not noting significant improvement in symptoms in the next 48 hours that he can monitor. - benzonatate (TESSALON) 100 MG capsule; Take 1 capsule (100 mg total) by mouth 3 (three) times daily as needed for cough.  Dispense: 30 capsule; Refill: 1  2. Acute non-recurrent sinusitis, unspecified location Antibiotic as directed.  Let me know if not feeling better with treatment or certainly if any worsening of symptoms.  We discussed if worsening he needs to be seen in person either at urgent care, or perhaps that our respiratory clinic if we are able to arrange. - doxycycline (VIBRA-TABS) 100 MG tablet; Take 1 tablet (100 mg total) by mouth 2 (two) times daily for 7 days.  Dispense: 14 tablet; Refill: 0  3. COVID-19 See above.  Recommended quarantining per CDC guidelines.  We discussed possibility of worsening of breathing and hypoxia as well as timeline for expected cough.   Follow Up Instructions: Return if symptoms worsen or fail to improve.     [id number] [id number] [id number] 21-30 I did not refer this patient for an OV in the next 24 hours for this/these issue(s).  I discussed the assessment and  treatment plan with the patient. The patient was provided an opportunity to ask questions and all were answered. The patient agreed with the plan and demonstrated an understanding of the instructions.   The patient was advised to call back or seek an in-person evaluation if the symptoms worsen or if the condition fails to improve as anticipated.  I provided 25 minutes of non-face-to-face time during this  encounter.   Micheline Rough, MD

## 2020-01-05 NOTE — Telephone Encounter (Signed)
Spoke with the pt and informed him of the message below.  Patient preferred to have an appt today and a message was sent to [facility] to add the time slot.

## 2020-01-07 ENCOUNTER — Encounter: Payer: Self-pay | Admitting: Family Medicine

## 2020-01-21 ENCOUNTER — Encounter: Payer: Self-pay | Admitting: Family Medicine

## 2020-01-21 MED ORDER — TADALAFIL 5 MG PO TABS: 5.0000 mg | ORAL_TABLET | Freq: Every day | ORAL | 5 refills | Status: DC | PRN

## 2020-02-07 ENCOUNTER — Encounter: Payer: Self-pay | Admitting: Family Medicine

## 2020-03-09 ENCOUNTER — Encounter: Payer: Self-pay | Admitting: Family Medicine

## 2020-04-16 ENCOUNTER — Encounter: Payer: Self-pay | Admitting: Family Medicine

## 2020-06-23 ENCOUNTER — Ambulatory Visit (INDEPENDENT_AMBULATORY_CARE_PROVIDER_SITE_OTHER): Payer: 59 | Admitting: Family Medicine

## 2020-06-23 ENCOUNTER — Encounter: Payer: Self-pay | Admitting: Family Medicine

## 2020-06-23 ENCOUNTER — Other Ambulatory Visit: Payer: Self-pay

## 2020-06-23 VITALS — BP 162/100 | HR 79 | Temp 97.8°F | Ht 73.0 in | Wt 225.2 lb

## 2020-06-23 DIAGNOSIS — N529 Male erectile dysfunction, unspecified: Secondary | ICD-10-CM | POA: Diagnosis not present

## 2020-06-23 DIAGNOSIS — D229 Melanocytic nevi, unspecified: Secondary | ICD-10-CM | POA: Diagnosis not present

## 2020-06-23 DIAGNOSIS — E785 Hyperlipidemia, unspecified: Secondary | ICD-10-CM | POA: Insufficient documentation

## 2020-06-23 DIAGNOSIS — R748 Abnormal levels of other serum enzymes: Secondary | ICD-10-CM | POA: Diagnosis not present

## 2020-06-23 DIAGNOSIS — I10 Essential (primary) hypertension: Secondary | ICD-10-CM | POA: Diagnosis not present

## 2020-06-23 MED ORDER — HYDROCHLOROTHIAZIDE 25 MG PO TABS
25.0000 mg | ORAL_TABLET | Freq: Every day | ORAL | 3 refills | Status: DC
Start: 2020-06-23 — End: 2020-09-27

## 2020-06-23 MED ORDER — AMLODIPINE BESYLATE 5 MG PO TABS: 5.0000 mg | ORAL_TABLET | Freq: Every day | ORAL | 1 refills | Status: DC

## 2020-06-23 MED ORDER — TADALAFIL 5 MG PO TABS: 5.0000 mg | ORAL_TABLET | Freq: Every day | ORAL | 5 refills | Status: DC | PRN

## 2020-06-23 NOTE — Progress Notes (Signed)
Christopher Salinas DOB: 1961-06-24 Encounter date: 06/23/2020  This is a 59 y.o. male who presents with Chief Complaint  Patient presents with  . Follow-up    History of present illness: Last visit was January/2021.  This was for Covid follow-up.   Hypertension: he is checking blood pressure at home and is all over the place. Hasn't seen diastolic up to 643 in a long time. Has gained weight. Usually 118-173 usually more in 329-518 systolic and diastolic upper 84'Z-66.   Acid reflux - hasn't had this before but after last visit seemed to start having this after last visit. If eats later at night - sometimes can have issue with swallowing and feels like he gets sick/feels sick.   History of bladder cancer/renal mass: had imaging scheduled and he needed to reschedule and then scheduler got COVID and didn't come back to work. States that urology was not adamant about re-imaging. ([place] 09/2018 showed stable renal cyst per urology note 07/2019)  Has some follow up imaging end of August - cystoscopy planned for that visit.   Had side effects that were slightly different with each generic of the tadalafil. Doesn't take daily, but notes cough when he does take it. Feels like it is not consistent between doses.  Feels that there is a difference in side effects between manufacturers.  Has multiple skin tags doing getting irritated and are bothering him.  Would like to get these removed today if possible.  No Known Allergies Current Meds  Medication Sig  . albuterol (PROVENTIL HFA;VENTOLIN HFA) 108 (90 Base) MCG/ACT inhaler Inhale 2 puffs into the lungs every 6 hours as needed.  Marland Kitchen amLODipine (NORVASC) 5 MG tablet Take 1 tablet (5 mg total) by mouth daily.  . benzonatate (TESSALON) 100 MG capsule Take 1 capsule (100 mg total) by mouth 3 (three) times daily as needed for cough.  . hydrocortisone (ANUSOL-HC) 2.5 % rectal cream Place 1 application rectally 2 (two) times daily.  Marland Kitchen Spacer/Aero-Holding  Chambers DEVI Inhale 2 puffs into the lungs every 6 hours as needed  . tadalafil (CIALIS) 5 MG tablet Take 1 tablet (5 mg total) by mouth daily as needed for erectile dysfunction.    Review of Systems  Constitutional: Negative for chills, fatigue and fever.  Respiratory: Negative for cough, chest tightness, shortness of breath and wheezing.   Cardiovascular: Negative for chest pain, palpitations and leg swelling.    Objective:  BP (!) 162/100 (BP Location: Left Arm, Patient Position: Sitting, Cuff Size: Large)   Pulse 79   Temp 97.8 F (36.6 C) (Oral)   Ht 6\' 1"  (1.854 m)   Wt 225 lb 3.2 oz (102.2 kg)   BMI 29.71 kg/m   Weight: 225 lb 3.2 oz (102.2 kg)   BP Readings from Last 3 Encounters:  06/23/20 (!) 162/100  12/24/19 (!) 150/80  12/16/18 (!) 150/80   Wt Readings from Last 3 Encounters:  06/23/20 225 lb 3.2 oz (102.2 kg)  12/24/19 224 lb 9.6 oz (101.9 kg)  12/16/18 217 lb 9.6 oz (98.7 kg)    Physical Exam Constitutional:      General: He is not in acute distress.    Appearance: He is well-developed.  Cardiovascular:     Rate and Rhythm: Normal rate and regular rhythm.     Heart sounds: Normal heart sounds. No murmur heard.  No friction rub.  Pulmonary:     Effort: Pulmonary effort is normal. No respiratory distress.     Breath sounds: Normal  breath sounds. No wheezing or rales.  Musculoskeletal:     Right lower leg: No edema.     Left lower leg: No edema.  Skin:    Comments: Skin tag and mole right upper shoulder, 3 skin tags noted in right axillary region, one skin tag right lower abdomen.  Neurological:     Mental Status: He is alert and oriented to person, place, and time.  Psychiatric:        Behavior: Behavior normal.    Shave Biopsy Procedure Note  Pre-operative Diagnosis: Mole right upper shoulder, multiple skin tags  Post-operative Diagnosis: same  Locations: 0.25 cm flesh colored mole right upper shoulder, 1 skin tag noted right upper shoulder,  3 skin tags noted right axilla, one skin tag noted right lower abdomen   anesthesia: Lidocaine 1% with epinephrine   Procedure Details  Patient informed of the risks, including bleeding and infection, and benefits of the  procedure and Verbal informed consent obtained. The lesion and surrounding area was given a sterile prep using chloraprep and draped in the usual sterile fashion. The skin lesion was shaved superficially using a dermablade.  Hemostasis of biopsy site was achieved using aluminum chloride. Sterile dressing applied. The specimen was sent for pathologic examination. The patient tolerated the procedure well.  This procedure was used for 5 skin tags as well as the mole on the right shoulder.  Complications: none.  Plan: 1. Instructed to keep the wound dry and covered for 24 hrs and clean thereafter with soap and water.  But no chlorine hot tubs or pool exposure to surgical site.  2. Warning signs of infection were reviewed.   3. Recommended that the patient use OTC analgesics as needed for pain.  4. Will contact patient with pathology results when obtained.   Assessment/Plan  1. Essential hypertension Blood pressure is not optimally controlled.  We are going to continue amlodipine 5 mg at bedtime, but add in hydrochlorothiazide 25 mg in the morning.  I have encouraged him to continue to check blood pressures at home.  We will get lab work today and plan follow-up pending these results. - CBC with Differential/Platelet; Future - Comprehensive metabolic panel; Future - Comprehensive metabolic panel - CBC with Differential/Platelet - amLODipine (NORVASC) 5 MG tablet; Take 1 tablet (5 mg total) by mouth at bedtime.  Dispense: 90 tablet; Refill: 1  2. Elevated alkaline phosphatase level Recheck blood work today. - Gamma GT; Future - Gamma GT  3. Hyperlipidemia, unspecified hyperlipidemia type Currently diet controlled, but we will recheck and consider need for treatment. -  Lipid panel; Future - Lipid panel  4. Erectile dysfunction, unspecified erectile dysfunction type I have printed prescription for tadalafil and encouraged him to talk with his pharmacist about finding manufacturer that he feels he better tolerates.  Unfortunately, he is not sure which doses or from which manufacturer he has done well with.  5. Skin mole Skin will send for pathology today.  Suspect benign, but since different than other typical skin tags will send for evaluation. - PR EXC SKIN BENIG <0.5 CM TRUNK,ARM,LEG - Dermatology pathology    Return in about 6 months (around 12/24/2020) for physical exam.  Time spent discussing chronic conditions, change in medications, follow-up plan, chart review/specialty follow-up, and charting 45 minutes (this does not include time for procedure).  Micheline Rough, MD

## 2020-06-24 ENCOUNTER — Telehealth: Payer: Self-pay | Admitting: Family Medicine

## 2020-06-24 LAB — LIPID PANEL
Cholesterol: 240 mg/dL — ABNORMAL HIGH (ref <200)
HDL: 37 mg/dL — ABNORMAL LOW (ref >=40)
LDL Cholesterol (Calc): 165 mg/dL (calc) — ABNORMAL HIGH
Non-HDL Cholesterol (Calc): 203 mg/dL (calc) — ABNORMAL HIGH (ref <130)
Total CHOL/HDL Ratio: 6.5 (calc) — ABNORMAL HIGH (ref <5.0)
Triglycerides: 213 mg/dL — ABNORMAL HIGH (ref <150)

## 2020-06-24 LAB — CBC WITH DIFFERENTIAL/PLATELET
Absolute Monocytes: 637 cells/uL (ref 200–950)
Basophils Absolute: 32 cells/uL (ref 0–200)
Basophils Relative: 0.6 %
Eosinophils Absolute: 151 cells/uL (ref 15–500)
Eosinophils Relative: 2.8 %
HCT: 45.7 % (ref 38.5–50.0)
Hemoglobin: 15.4 g/dL (ref 13.2–17.1)
Lymphs Abs: 1382 cells/uL (ref 850–3900)
MCH: 29.3 pg (ref 27.0–33.0)
MCHC: 33.7 g/dL (ref 32.0–36.0)
MCV: 86.9 fL (ref 80.0–100.0)
MPV: 10.3 fL (ref 7.5–12.5)
Monocytes Relative: 11.8 %
Neutro Abs: 3197 cells/uL (ref 1500–7800)
Neutrophils Relative %: 59.2 %
Platelets: 219 10*3/uL (ref 140–400)
RBC: 5.26 10*6/uL (ref 4.20–5.80)
RDW: 12.5 % (ref 11.0–15.0)
Total Lymphocyte: 25.6 %
WBC: 5.4 10*3/uL (ref 3.8–10.8)

## 2020-06-24 LAB — COMPREHENSIVE METABOLIC PANEL
AG Ratio: 1.8 (calc) (ref 1.0–2.5)
ALT: 20 U/L (ref 9–46)
AST: 21 U/L (ref 10–35)
Albumin: 4.4 g/dL (ref 3.6–5.1)
Alkaline phosphatase (APISO): 98 U/L (ref 35–144)
BUN: 13 mg/dL (ref 7–25)
CO2: 25 mmol/L (ref 20–32)
Calcium: 9.3 mg/dL (ref 8.6–10.3)
Chloride: 105 mmol/L (ref 98–110)
Creat: 0.95 mg/dL (ref 0.70–1.33)
Globulin: 2.5 g/dL (calc) (ref 1.9–3.7)
Glucose, Bld: 101 mg/dL — ABNORMAL HIGH (ref 65–99)
Potassium: 4.2 mmol/L (ref 3.5–5.3)
Sodium: 138 mmol/L (ref 135–146)
Total Bilirubin: 0.7 mg/dL (ref 0.2–1.2)
Total Protein: 6.9 g/dL (ref 6.1–8.1)

## 2020-06-24 LAB — GAMMA GT: GGT: 23 U/L (ref 3–85)

## 2020-06-24 NOTE — Telephone Encounter (Signed)
I left a detailed message on Christopher Salinas's voicemail stating per office notes the mole was removed from the right upper shoulder.

## 2020-06-24 NOTE — Telephone Encounter (Signed)
Christopher Salinas with [facility], needs to know where the mole was taking from. Christopher Salinas can be reached at [phone] and said, it's okay to leave a message if she doesn't answer. Thanks

## 2020-06-28 ENCOUNTER — Telehealth: Payer: Self-pay | Admitting: Family Medicine

## 2020-06-28 NOTE — Telephone Encounter (Signed)
Spoke with Christopher Salinas and informed her of the site per office notes and she stated Almyra Free relayed this message to her also.

## 2020-06-28 NOTE — Telephone Encounter (Signed)
Missy with [facility], would like to know where pt had the biopsy site. Missy can be reached at #[phone].

## 2020-08-08 ENCOUNTER — Encounter: Payer: Self-pay | Admitting: Family Medicine

## 2020-08-09 NOTE — Telephone Encounter (Signed)
Spoke with the pt and advised him he needs an appt due to the symptoms below. An appt was offered twice with Dr Elease Hashimoto as PCP does not have any openings.  Patient declined an appt and stated he does not want to come in today and thought we could tell him what to do over the phone.  Message sent to PCP.

## 2020-09-27 ENCOUNTER — Other Ambulatory Visit: Payer: Self-pay

## 2020-09-27 ENCOUNTER — Ambulatory Visit (INDEPENDENT_AMBULATORY_CARE_PROVIDER_SITE_OTHER): Payer: 59 | Admitting: Family Medicine

## 2020-09-27 ENCOUNTER — Encounter: Payer: Self-pay | Admitting: Family Medicine

## 2020-09-27 VITALS — BP 140/100 | HR 88 | Temp 98.0°F | Ht 73.0 in | Wt 222.4 lb

## 2020-09-27 DIAGNOSIS — E785 Hyperlipidemia, unspecified: Secondary | ICD-10-CM

## 2020-09-27 DIAGNOSIS — I10 Essential (primary) hypertension: Secondary | ICD-10-CM

## 2020-09-27 MED ORDER — HYDROCHLOROTHIAZIDE 25 MG PO TABS: 25.0000 mg | ORAL_TABLET | Freq: Every day | ORAL | 3 refills | Status: DC

## 2020-09-27 MED ORDER — AMLODIPINE BESYLATE 10 MG PO TABS: 10.0000 mg | ORAL_TABLET | Freq: Every day | ORAL | 3 refills | Status: DC

## 2020-09-27 NOTE — Progress Notes (Addendum)
Christopher Salinas DOB: 1961/02/25 Encounter date: 09/27/2020  This is a 59 y.o. male who presents with Chief Complaint  Patient presents with  . Follow-up    History of present illness: At last visit we kept amlodipine 5mg  at bedtime and added in hctz 25mg  in the morning. Has been checking at home, and states that diastolic has stayed higher and pulse has been a little higher. Usually HR has been in the 70's, but has been running a little higher.   Had cystoscopy 8/25 - normal bladder and prostate. Plan to repeat in 12 mo with possible fulguration.   Had hemorrhoids worked on; no bleeding since August. Wife was able to keep finger (had crush injury in January and has been struggling with this but finally got some improvement).   Has had a lot going on: MIL Has renovated one house, sold another. His dad has been sick for a year. Just got out of hospital on Friday. Wife's grandmother passed. MIL fell and broke arm.   No Known Allergies Current Meds  Medication Sig  . albuterol (PROVENTIL HFA;VENTOLIN HFA) 108 (90 Base) MCG/ACT inhaler Inhale 2 puffs into the lungs every 6 hours as needed.  . hydrochlorothiazide (HYDRODIURIL) 25 MG tablet Take 1 tablet (25 mg total) by mouth daily.  . hydrocortisone (ANUSOL-HC) 2.5 % rectal cream Place 1 application rectally 2 (two) times daily.  Marland Kitchen Spacer/Aero-Holding Chambers DEVI Inhale 2 puffs into the lungs every 6 hours as needed  . tadalafil (CIALIS) 5 MG tablet Take 1 tablet (5 mg total) by mouth daily as needed for erectile dysfunction.  . [DISCONTINUED] amLODipine (NORVASC) 5 MG tablet Take 1 tablet (5 mg total) by mouth at bedtime.  . [DISCONTINUED] hydrochlorothiazide (HYDRODIURIL) 25 MG tablet Take 1 tablet (25 mg total) by mouth daily.    Review of Systems  Constitutional: Negative for chills, fatigue and fever.  Respiratory: Negative for cough, chest tightness, shortness of breath and wheezing.   Cardiovascular: Negative for chest  pain, palpitations and leg swelling.    Objective:  BP (!) 140/100 (BP Location: Left Arm, Patient Position: Sitting, Cuff Size: Large)   Pulse 88   Temp 98 F (36.7 C) (Oral)   Ht 6\' 1"  (1.854 m)   Wt 222 lb 6.4 oz (100.9 kg)   BMI 29.34 kg/m   Weight: 222 lb 6.4 oz (100.9 kg)   BP Readings from Last 3 Encounters:  09/27/20 (!) 140/100  06/23/20 (!) 162/100  12/24/19 (!) 150/80   Wt Readings from Last 3 Encounters:  09/27/20 222 lb 6.4 oz (100.9 kg)  06/23/20 225 lb 3.2 oz (102.2 kg)  12/24/19 224 lb 9.6 oz (101.9 kg)    Physical Exam Constitutional:      General: He is not in acute distress.    Appearance: He is well-developed.  Cardiovascular:     Rate and Rhythm: Normal rate and regular rhythm.     Heart sounds: Normal heart sounds. No murmur heard.  No friction rub.  Pulmonary:     Effort: Pulmonary effort is normal. No respiratory distress.     Breath sounds: Normal breath sounds. No wheezing or rales.  Musculoskeletal:     Right lower leg: No edema.     Left lower leg: No edema.  Neurological:     Mental Status: He is alert and oriented to person, place, and time.  Psychiatric:        Behavior: Behavior normal.     Assessment/Plan  1. Primary hypertension  Increase amlodipine to 10mg  and keep hctz 25mg  daily.  2. Hyperlipidemia, unspecified hyperlipidemia type Has been diet controlled.     Return in about 3 months (around 12/28/2020) for Chronic condition visit.  Does not want COVID vaccination. Discussed pros of this and limited cons with this. Declines flu shot as well.    Micheline Rough, MD

## 2020-12-24 ENCOUNTER — Other Ambulatory Visit: Payer: Self-pay

## 2020-12-24 ENCOUNTER — Ambulatory Visit (INDEPENDENT_AMBULATORY_CARE_PROVIDER_SITE_OTHER): Payer: 59 | Admitting: Family Medicine

## 2020-12-24 ENCOUNTER — Encounter: Payer: Self-pay | Admitting: Family Medicine

## 2020-12-24 VITALS — BP 140/92 | HR 98 | Temp 97.4°F | Ht 73.0 in | Wt 219.8 lb

## 2020-12-24 DIAGNOSIS — L918 Other hypertrophic disorders of the skin: Secondary | ICD-10-CM

## 2020-12-24 DIAGNOSIS — R739 Hyperglycemia, unspecified: Secondary | ICD-10-CM | POA: Diagnosis not present

## 2020-12-24 DIAGNOSIS — E785 Hyperlipidemia, unspecified: Secondary | ICD-10-CM | POA: Diagnosis not present

## 2020-12-24 DIAGNOSIS — I10 Essential (primary) hypertension: Secondary | ICD-10-CM | POA: Diagnosis not present

## 2020-12-24 DIAGNOSIS — Z0000 Encounter for general adult medical examination without abnormal findings: Secondary | ICD-10-CM | POA: Diagnosis not present

## 2020-12-24 LAB — CBC WITH DIFFERENTIAL/PLATELET
Basophils Absolute: 0.1 10*3/uL (ref 0.0–0.1)
Basophils Relative: 0.8 % (ref 0.0–3.0)
Eosinophils Absolute: 0.3 10*3/uL (ref 0.0–0.7)
Eosinophils Relative: 3.5 % (ref 0.0–5.0)
HCT: 44.5 % (ref 39.0–52.0)
Hemoglobin: 15.7 g/dL (ref 13.0–17.0)
Lymphocytes Relative: 33.2 % (ref 12.0–46.0)
Lymphs Abs: 2.4 10*3/uL (ref 0.7–4.0)
MCHC: 35.2 g/dL (ref 30.0–36.0)
MCV: 83.7 fl (ref 78.0–100.0)
Monocytes Absolute: 0.8 10*3/uL (ref 0.1–1.0)
Monocytes Relative: 11 % (ref 3.0–12.0)
Neutro Abs: 3.8 10*3/uL (ref 1.4–7.7)
Neutrophils Relative %: 51.5 % (ref 43.0–77.0)
Platelets: 213 10*3/uL (ref 150.0–400.0)
RBC: 5.32 Mil/uL (ref 4.22–5.81)
RDW: 12.5 % (ref 11.5–15.5)
WBC: 7.3 10*3/uL (ref 4.0–10.5)

## 2020-12-24 LAB — LIPID PANEL
Cholesterol: 254 mg/dL — ABNORMAL HIGH (ref 0–200)
HDL: 41.8 mg/dL (ref >39.00)
NonHDL: 212.6
Total CHOL/HDL Ratio: 6
Triglycerides: 218 mg/dL — ABNORMAL HIGH (ref 0.0–149.0)
VLDL: 43.6 mg/dL — ABNORMAL HIGH (ref 0.0–40.0)

## 2020-12-24 LAB — COMPREHENSIVE METABOLIC PANEL
ALT: 21 U/L (ref 0–53)
AST: 19 U/L (ref 0–37)
Albumin: 4.6 g/dL (ref 3.5–5.2)
Alkaline Phosphatase: 86 U/L (ref 39–117)
BUN: 15 mg/dL (ref 6–23)
CO2: 29 mEq/L (ref 19–32)
Calcium: 9.5 mg/dL (ref 8.4–10.5)
Chloride: 99 mEq/L (ref 96–112)
Creatinine, Ser: 1.02 mg/dL (ref 0.40–1.50)
GFR: 81.62 mL/min (ref >60.00)
Glucose, Bld: 111 mg/dL — ABNORMAL HIGH (ref 70–99)
Potassium: 3.4 mEq/L — ABNORMAL LOW (ref 3.5–5.1)
Sodium: 138 mEq/L (ref 135–145)
Total Bilirubin: 0.6 mg/dL (ref 0.2–1.2)
Total Protein: 7.1 g/dL (ref 6.0–8.3)

## 2020-12-24 LAB — LDL CHOLESTEROL, DIRECT: Direct LDL: 182 mg/dL

## 2020-12-24 LAB — HEMOGLOBIN A1C: Hgb A1c MFr Bld: 5.6 % (ref 4.6–6.5)

## 2020-12-24 NOTE — Progress Notes (Signed)
Raygen Dahm DOB: Jul 26, 1961 Encounter date: 12/24/2020  This is a 60 y.o. male who presents for complete physical   History of present illness/Additional concerns: Has spot on chest that is very sensitive - came up Christmas eve. He can't see it well; wife states looks like aggravated skin tag.   HTN: amlodipine 10mg  and hctz 25mg  daily. Numbers better at home. Always elevated here. Feels like pressures are higher with increase in amlodipine. Was seeing numbers more in the high 120's and over 80's; now a little higher.   HL: has been diet controlled.  It has been very stressful few months.  His father passed away.  Multiple members of his church have been sick with [place].   Past Medical History:  Diagnosis Date  . Bladder cancer ([place])   . Renal mass    Past Surgical History:  Procedure Laterality Date  . BLADDER TUMOR EXCISION  2019  . COLONOSCOPY  2016  . HEMORRHOID BANDING  11/2018  . OPEN ANTERIOR SHOULDER RECONSTRUCTION Right 1980   No Known Allergies Current Meds  Medication Sig  . amLODipine (NORVASC) 10 MG tablet Take 1 tablet (10 mg total) by mouth daily.  . hydrochlorothiazide (HYDRODIURIL) 25 MG tablet Take 1 tablet (25 mg total) by mouth daily.  . Multiple Vitamin (MULTIVITAMIN ADULT PO) Take by mouth daily.  . tadalafil (CIALIS) 5 MG tablet Take 1 tablet (5 mg total) by mouth daily as needed for erectile dysfunction.   Social History   Tobacco Use  . Smoking status: Former Smoker    Types: Cigars, Cigarettes  . Smokeless tobacco: Never Used  Substance Use Topics  . Alcohol use: Not Currently   Family History  Problem Relation Age of Onset  . High Cholesterol Mother   . High blood pressure Mother   . Cancer Father        melanoma  . High blood pressure Father   . Heart failure Father   . High blood pressure Sister   . High blood pressure Maternal Grandmother   . Kidney disease Maternal Grandmother   . Lung cancer Paternal Grandmother         smoker  . COPD Paternal Grandmother   . Lung cancer Paternal Grandfather        smoker  . High blood pressure Son      Review of Systems  Constitutional: Negative for activity change, appetite change, chills, fatigue, fever and unexpected weight change.  HENT: Negative for congestion, ear pain, hearing loss, sinus pressure, sinus pain, sore throat and trouble swallowing.   Eyes: Negative for pain and visual disturbance.  Respiratory: Negative for cough, chest tightness, shortness of breath and wheezing.   Cardiovascular: Negative for chest pain, palpitations and leg swelling.  Gastrointestinal: Negative for abdominal distention, abdominal pain, blood in stool, constipation, diarrhea, nausea and vomiting.  Genitourinary: Negative for decreased urine volume, difficulty urinating, dysuria, penile pain and testicular pain.  Musculoskeletal: Negative for arthralgias, back pain and joint swelling.  Skin: Negative for rash.  Neurological: Negative for dizziness, weakness, numbness and headaches.  Hematological: Negative for adenopathy. Does not bruise/bleed easily.  Psychiatric/Behavioral: Negative for agitation, sleep disturbance and suicidal ideas. The patient is not nervous/anxious.     CBC:  Lab Results  Component Value Date   WBC 5.4 06/23/2020   HGB 15.4 06/23/2020   HCT 45.7 06/23/2020   MCH 29.3 06/23/2020   MCHC 33.7 06/23/2020   RDW 12.5 06/23/2020   PLT 219 06/23/2020   MPV  10.3 06/23/2020   CMP: Lab Results  Component Value Date   NA 138 06/23/2020   K 4.2 06/23/2020   CL 105 06/23/2020   CO2 25 06/23/2020   GLUCOSE 101 (H) 06/23/2020   BUN 13 06/23/2020   CREATININE 0.95 06/23/2020   CALCIUM 9.3 06/23/2020   PROT 6.9 06/23/2020   BILITOT 0.7 06/23/2020   ALKPHOS 144 (H) 12/24/2019   ALT 20 06/23/2020   AST 21 06/23/2020   LIPID: Lab Results  Component Value Date   CHOL 240 (H) 06/23/2020   TRIG 213 (H) 06/23/2020   HDL 37 (L) 06/23/2020   LDLCALC 165  (H) 06/23/2020    Objective:  BP (!) 140/92   Pulse 98   Temp (!) 97.4 F (36.3 C) (Oral)   Ht 6\' 1"  (1.854 m)   Wt 219 lb 12.8 oz (99.7 kg)   SpO2 96%   BMI 29.00 kg/m   Weight: 219 lb 12.8 oz (99.7 kg)   BP Readings from Last 3 Encounters:  12/24/20 (!) 140/92  09/27/20 (!) 140/100  06/23/20 (!) 162/100   Wt Readings from Last 3 Encounters:  12/24/20 219 lb 12.8 oz (99.7 kg)  09/27/20 222 lb 6.4 oz (100.9 kg)  06/23/20 225 lb 3.2 oz (102.2 kg)    Physical Exam Constitutional:      General: He is not in acute distress.    Appearance: He is well-developed and well-nourished.  HENT:     Head: Normocephalic and atraumatic.     Right Ear: External ear normal.     Left Ear: External ear normal.     Nose: Nose normal.     Mouth/Throat:     Mouth: Oropharynx is clear and moist.     Pharynx: No oropharyngeal exudate.  Eyes:     Conjunctiva/sclera: Conjunctivae normal.     Pupils: Pupils are equal, round, and reactive to light.  Neck:     Thyroid: No thyromegaly.  Cardiovascular:     Rate and Rhythm: Normal rate and regular rhythm.     Pulses: Intact distal pulses.     Heart sounds: Normal heart sounds. No murmur heard. No friction rub. No gallop.   Pulmonary:     Effort: Pulmonary effort is normal. No respiratory distress.     Breath sounds: Normal breath sounds. No stridor. No wheezing or rales.  Abdominal:     General: Bowel sounds are normal.     Palpations: Abdomen is soft.  Musculoskeletal:        General: Normal range of motion.     Cervical back: Neck supple.  Skin:    General: Skin is warm and dry.     Comments: Skin tag left nipple - not tender to touch.  Neurological:     Mental Status: He is alert and oriented to person, place, and time.  Psychiatric:        Mood and Affect: Mood and affect normal.        Behavior: Behavior normal.        Thought Content: Thought content normal.        Judgment: Judgment normal.   Cryotherapy procedure  note: Location left nipple; diagnosis skin tag After verbal consent obtained, small hemostat was frozen in liquid nitrogen and then applied in 3 cycles to skin tag.  Patient tolerated this procedure well without any discomfort.  Advised to keep antibiotic ointment and cover area if uncomfortable/rubbing against her.  Let me know if not resolved in 2 weeks  time.  Assessment/Plan: There are no preventive care reminders to display for this patient. Health Maintenance reviewed.  1. Preventative health care Encouraged healthy eating and regular exercise.  2. Hyperglycemia Encouraged low carbohydrate diet.  Recheck A1c today. - Hemoglobin A1c; Future - Hemoglobin A1c  3. Primary hypertension Blood pressure is always higher in the office than it is at home.  I am going to have him update me on blood pressure numbers when we call with lab results.  If still elevated will need to add a new medication.  Of note, he had cough with ACE and ARB. - CBC with Differential/Platelet; Future - Comprehensive metabolic panel; Future - CBC with Differential/Platelet - Comprehensive metabolic panel  4. Hyperlipidemia, unspecified hyperlipidemia type We will recheck labs today. - Lipid panel; Future - Lipid panel  5. Skin tag Cryotherapy treatment in office.  Should follow off next 2 weeks.  Wound care discussed. - PR DESTRUCTION BENIGN LESIONS UP TO 14   Return in about 6 months (around 06/23/2021) for Chronic condition visit.  Micheline Rough, MD

## 2020-12-24 NOTE — Patient Instructions (Signed)
Check pressures at home through weekend. When I call with bloodwork results; we can look over numbers together. Check no more than twice daily. Best time to check is 1 hour after medications but can also check before meds.

## 2021-02-02 ENCOUNTER — Encounter: Payer: Self-pay | Admitting: Family Medicine

## 2021-02-03 MED ORDER — TADALAFIL 5 MG PO TABS: 5.0000 mg | ORAL_TABLET | Freq: Every day | ORAL | 5 refills | Status: DC | PRN

## 2021-08-22 ENCOUNTER — Ambulatory Visit (INDEPENDENT_AMBULATORY_CARE_PROVIDER_SITE_OTHER): Payer: 59 | Admitting: Family Medicine

## 2021-08-22 ENCOUNTER — Encounter: Payer: Self-pay | Admitting: Family Medicine

## 2021-08-22 ENCOUNTER — Other Ambulatory Visit: Payer: Self-pay

## 2021-08-22 VITALS — BP 142/90 | HR 83 | Temp 97.6°F | Ht 73.0 in | Wt 221.2 lb

## 2021-08-22 DIAGNOSIS — R739 Hyperglycemia, unspecified: Secondary | ICD-10-CM

## 2021-08-22 DIAGNOSIS — E785 Hyperlipidemia, unspecified: Secondary | ICD-10-CM

## 2021-08-22 DIAGNOSIS — R5383 Other fatigue: Secondary | ICD-10-CM | POA: Diagnosis not present

## 2021-08-22 DIAGNOSIS — I10 Essential (primary) hypertension: Secondary | ICD-10-CM | POA: Diagnosis not present

## 2021-08-22 DIAGNOSIS — R0683 Snoring: Secondary | ICD-10-CM

## 2021-08-22 LAB — HEMOGLOBIN A1C: Hgb A1c MFr Bld: 5.5 % (ref 4.6–6.5)

## 2021-08-22 LAB — CBC WITH DIFFERENTIAL/PLATELET
Basophils Absolute: 0.1 10*3/uL (ref 0.0–0.1)
Basophils Relative: 0.8 % (ref 0.0–3.0)
Eosinophils Absolute: 0.2 10*3/uL (ref 0.0–0.7)
Eosinophils Relative: 3.1 % (ref 0.0–5.0)
HCT: 44.7 % (ref 39.0–52.0)
Hemoglobin: 15.2 g/dL (ref 13.0–17.0)
Lymphocytes Relative: 25.1 % (ref 12.0–46.0)
Lymphs Abs: 1.5 10*3/uL (ref 0.7–4.0)
MCHC: 34 g/dL (ref 30.0–36.0)
MCV: 86.2 fl (ref 78.0–100.0)
Monocytes Absolute: 0.6 10*3/uL (ref 0.1–1.0)
Monocytes Relative: 10.1 % (ref 3.0–12.0)
Neutro Abs: 3.7 10*3/uL (ref 1.4–7.7)
Neutrophils Relative %: 60.9 % (ref 43.0–77.0)
Platelets: 245 10*3/uL (ref 150.0–400.0)
RBC: 5.18 Mil/uL (ref 4.22–5.81)
RDW: 12.9 % (ref 11.5–15.5)
WBC: 6.1 10*3/uL (ref 4.0–10.5)

## 2021-08-22 LAB — TSH: TSH: 0.7 u[IU]/mL (ref 0.35–5.50)

## 2021-08-22 LAB — COMPREHENSIVE METABOLIC PANEL
ALT: 17 U/L (ref 0–53)
AST: 17 U/L (ref 0–37)
Albumin: 4.5 g/dL (ref 3.5–5.2)
Alkaline Phosphatase: 100 U/L (ref 39–117)
BUN: 10 mg/dL (ref 6–23)
CO2: 26 mEq/L (ref 19–32)
Calcium: 9.8 mg/dL (ref 8.4–10.5)
Chloride: 101 mEq/L (ref 96–112)
Creatinine, Ser: 0.92 mg/dL (ref 0.40–1.50)
GFR: 91.96 mL/min (ref >60.00)
Glucose, Bld: 97 mg/dL (ref 70–99)
Potassium: 4.1 mEq/L (ref 3.5–5.1)
Sodium: 137 mEq/L (ref 135–145)
Total Bilirubin: 0.7 mg/dL (ref 0.2–1.2)
Total Protein: 7.3 g/dL (ref 6.0–8.3)

## 2021-08-22 LAB — IBC + FERRITIN
Ferritin: 154.3 ng/mL (ref 22.0–322.0)
Iron: 155 ug/dL (ref 42–165)
Saturation Ratios: 43.9 % (ref 20.0–50.0)
TIBC: 352.8 ug/dL (ref 250.0–450.0)
Transferrin: 252 mg/dL (ref 212.0–360.0)

## 2021-08-22 LAB — VITAMIN D 25 HYDROXY (VIT D DEFICIENCY, FRACTURES): VITD: 26.39 ng/mL — ABNORMAL LOW (ref 30.00–100.00)

## 2021-08-22 LAB — VITAMIN B12: Vitamin B-12: 388 pg/mL (ref 211–911)

## 2021-08-22 NOTE — Addendum Note (Signed)
Addended by: Elmer Picker on: 08/22/2021 09:55 AM   Modules accepted: Orders

## 2021-08-22 NOTE — Progress Notes (Signed)
Christopher Salinas DOB: 12-14-1960 Encounter date: 08/22/2021  This is a 60 y.o. male who presents with Chief Complaint  Patient presents with   Follow-up    History of present illness:  Last visit with me was 12/24/2020 for a physical.  Hypertension: Amlodipine 10 mg, hydrochlorothiazide 25 mg.  Blood pressures are always more elevated at the office than they are at home. 130's/70's. HR has increased since having covid.   Hyperlipidemia: Diet controlled  Has been following with urology - had a few mucosal lesions on last cystoscopy that were destroyed. Follow up in 1 year.   He feels tired, no drive to do things. Just not feeling energetic self in morning. Has felt this way at least a couple of years, but feels it is getting worse. Wife does state that he snores. No one says that he stops breathing. Decent variety of fruits, veggies.   Has had some numbness in first, second,fifth finger right hand, right foot - lateral edge ball to heel. Saw chiropractor and had adjustment and got feeling back in hand. Getting a lot of cramps in hamstrings and calves.   No Known Allergies Current Meds  Medication Sig   amLODipine (NORVASC) 10 MG tablet Take 1 tablet (10 mg total) by mouth daily.   Multiple Vitamin (MULTIVITAMIN ADULT PO) Take by mouth daily.   tadalafil (CIALIS) 5 MG tablet Take 1 tablet (5 mg total) by mouth daily as needed for erectile dysfunction.    Review of Systems  Constitutional:  Positive for fatigue. Negative for chills and fever.  Respiratory:  Negative for cough, chest tightness, shortness of breath and wheezing.   Cardiovascular:  Negative for chest pain, palpitations and leg swelling.   Objective:  BP (!) 142/90 (BP Location: Left Arm, Patient Position: Sitting, Cuff Size: Large)   Pulse 83   Temp 97.6 F (36.4 C) (Rectal)   Ht '6\' 1"'$  (1.854 m)   Wt 221 lb 3.2 oz (100.3 kg)   BMI 29.18 kg/m   Weight: 221 lb 3.2 oz (100.3 kg)   BP Readings from Last 3  Encounters:  08/22/21 (!) 142/90  12/24/20 (!) 140/92  09/27/20 (!) 140/100   Wt Readings from Last 3 Encounters:  08/22/21 221 lb 3.2 oz (100.3 kg)  12/24/20 219 lb 12.8 oz (99.7 kg)  09/27/20 222 lb 6.4 oz (100.9 kg)    Physical Exam Constitutional:      General: He is not in acute distress.    Appearance: He is well-developed.  Cardiovascular:     Rate and Rhythm: Normal rate and regular rhythm.     Heart sounds: Normal heart sounds. No murmur heard.   No friction rub.  Pulmonary:     Effort: Pulmonary effort is normal. No respiratory distress.     Breath sounds: Normal breath sounds. No wheezing or rales.  Musculoskeletal:     Right lower leg: No edema.     Left lower leg: No edema.  Neurological:     Mental Status: He is alert and oriented to person, place, and time.     Deep Tendon Reflexes:     Reflex Scores:      Tricep reflexes are 2+ on the right side and 2+ on the left side.      Bicep reflexes are 2+ on the right side and 2+ on the left side.      Brachioradialis reflexes are 2+ on the right side and 2+ on the left side.  Patellar reflexes are 2+ on the right side and 2+ on the left side.    Comments: Decreased sensation right lateral foot with monofilament.   Psychiatric:        Mood and Affect: Mood normal.        Behavior: Behavior normal.    Assessment/Plan  1. Primary hypertension Pressures at home are better than office (forgot chart and cuff today). Most of numbers at home in 130's.  - CBC with Differential/Platelet; Future - Comprehensive metabolic panel; Future  2. Hyperglycemia - Hemoglobin A1c; Future  3. Hyperlipidemia, unspecified hyperlipidemia type Has been reluctant to start treatment.  - NMR, lipoprofile; Future  4. Fatigue, unspecified type Start with sleep and lab evaluation.  - Ambulatory referral to Sleep Studies - Vitamin B12; Future - VITAMIN D 25 Hydroxy (Vit-D Deficiency, Fractures); Future - TSH; Future - IBC +  Ferritin; Future  5. Snoring - Ambulatory referral to Sleep Studies   Return in about 6 months (around 02/19/2022) for physical exam.     Micheline Rough, MD

## 2021-08-22 NOTE — Addendum Note (Signed)
Addended by: Elmer Picker on: 08/22/2021 09:56 AM   Modules accepted: Orders

## 2021-08-23 LAB — SPECIMEN STATUS REPORT

## 2021-08-25 ENCOUNTER — Telehealth: Payer: Self-pay

## 2021-08-25 NOTE — Telephone Encounter (Signed)
Patient returned call and was informed of results pt verbalized understanding.  Patient declined to take Crestor 5 mg and stated he is more concerned with his vitamin D he stated he is already taking a multivitamin with vitamin D.

## 2021-08-26 ENCOUNTER — Encounter: Payer: Self-pay | Admitting: Family Medicine

## 2021-08-26 NOTE — Telephone Encounter (Signed)
Slight insufficiency of vitamin d is not large health risk. Check amount in multivitamin. I had recommended 1000 units (usually there is much less in multivitamin). If he is already doing 1000 units he can increase to 2000 units. If he was doing 2000 units daily of vitamin D or higher already, let me know. We don't want to over-replace as there are consequences with that as well.

## 2021-08-27 LAB — NMR, LIPOPROFILE
Cholesterol, Total: 257 mg/dL — ABNORMAL HIGH (ref 100–199)
HDL Particle Number: 26.5 umol/L — ABNORMAL LOW (ref >=30.5)
HDL-C: 39 mg/dL — ABNORMAL LOW (ref >39)
LDL Particle Number: 2215 nmol/L — ABNORMAL HIGH (ref <1000)
LDL Size: 20.7 nm (ref >20.5)
LDL-C (NIH Calc): 188 mg/dL — ABNORMAL HIGH (ref 0–99)
LP-IR Score: 53 — ABNORMAL HIGH (ref <=45)
Small LDL Particle Number: 1337 nmol/L — ABNORMAL HIGH (ref <=527)
Triglycerides: 158 mg/dL — ABNORMAL HIGH (ref 0–149)

## 2021-08-29 NOTE — Telephone Encounter (Signed)
Patient informed of the message below.  Patient stated he is currently taking 1000 units and will begin taking 2000 units.  Patient questioned what brand he should take and I advised the patient [employer] branded vitamin D was tested and actually contains vitamin D and also one of our physicians recommends [employer] brand vitamin D also.  Physical exam appt was scheduled for 12/26/2021.

## 2021-11-16 ENCOUNTER — Encounter: Payer: Self-pay | Admitting: Neurology

## 2021-11-16 ENCOUNTER — Ambulatory Visit (INDEPENDENT_AMBULATORY_CARE_PROVIDER_SITE_OTHER): Payer: 59 | Admitting: Neurology

## 2021-11-16 ENCOUNTER — Other Ambulatory Visit: Payer: Self-pay

## 2021-11-16 VITALS — BP 160/93 | HR 83 | Ht 73.0 in | Wt 220.0 lb

## 2021-11-16 DIAGNOSIS — E7800 Pure hypercholesterolemia, unspecified: Secondary | ICD-10-CM | POA: Diagnosis not present

## 2021-11-16 DIAGNOSIS — T733XXD Exhaustion due to excessive exertion, subsequent encounter: Secondary | ICD-10-CM

## 2021-11-16 DIAGNOSIS — I10 Essential (primary) hypertension: Secondary | ICD-10-CM
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
## 2021-11-24 ENCOUNTER — Other Ambulatory Visit: Payer: Self-pay | Admitting: Family Medicine

## 2021-11-24 DIAGNOSIS — I10 Essential (primary) hypertension: Secondary | ICD-10-CM
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
## 2021-12-20 ENCOUNTER — Telehealth: Payer: Self-pay | Admitting: Neurology

## 2021-12-20 NOTE — Telephone Encounter (Signed)
LVM for pt to call me back to schedule sleep study  

## 2021-12-26 ENCOUNTER — Encounter: Payer: 59 | Admitting: Family Medicine

## 2021-12-27 ENCOUNTER — Ambulatory Visit (INDEPENDENT_AMBULATORY_CARE_PROVIDER_SITE_OTHER): Payer: 59 | Admitting: Family Medicine

## 2021-12-27 ENCOUNTER — Encounter: Payer: Self-pay | Admitting: Family Medicine

## 2021-12-27 ENCOUNTER — Telehealth: Payer: Self-pay | Admitting: Neurology

## 2021-12-27 VITALS — BP 160/100 | HR 91 | Temp 97.7°F | Ht 73.0 in | Wt 227.8 lb

## 2021-12-27 DIAGNOSIS — E785 Hyperlipidemia, unspecified: Secondary | ICD-10-CM | POA: Diagnosis not present

## 2021-12-27 DIAGNOSIS — I10 Essential (primary) hypertension: Secondary | ICD-10-CM

## 2021-12-27 DIAGNOSIS — L989 Disorder of the skin and subcutaneous tissue, unspecified: Secondary | ICD-10-CM

## 2021-12-27 DIAGNOSIS — Z0000 Encounter for general adult medical examination without abnormal findings: Secondary | ICD-10-CM

## 2021-12-27 MED ORDER — HYDROCHLOROTHIAZIDE 25 MG PO TABS: 25.0000 mg | ORAL_TABLET | Freq: Every day | ORAL | 3 refills | Status: DC

## 2021-12-27 NOTE — Progress Notes (Signed)
Christopher Salinas DOB: 09/12/1961 Encounter date: 12/27/2021  This is a 61 y.o. male who presents for complete physical   History of present illness/Additional concerns: [place] coming out of [place] last week. Thinks related to bifocal - has scratch in both lenses and has harder time with bifocal/vision/depth perception/transitions are affecting seeing down - harder on steps.   Hypertension: Amlodipine 10 mg, hydrochlorothiazide 25 mg.  Blood pressures are always more elevated at the office than they are at home. 130's/70's. HR has increased since having covid and feels that since that time (oct-nov) he has been higher. Has been higher at home last couple of weeks - similar to pressure here today. Has been 903'E systolic at home.    Hyperlipidemia: Diet controlled   Has been following with urology - had a few mucosal lesions on last cystoscopy that were destroyed. Follow up in 1 year.    He feels tired, no drive to do things. Just not feeling energetic self in morning. Has felt this way at least a couple of years, but feels it is getting worse. Wife does state that he snores. No one says that he stops breathing. Decent variety of fruits, veggies.    Has had some numbness in first, second,fifth finger right hand, right foot - lateral edge ball to heel. Saw chiropractor and had adjustment and got feeling back in hand. Getting a lot of cramps in hamstrings and calves.    The 10-year ASCVD risk score (Arnett DK, et al., 2019) is: 18%   Values used to calculate the score:     Age: 51 years     Sex: Male     Is Non-Hispanic African American: No     Diabetic: No     Tobacco smoker: No     Systolic Blood Pressure: 092 mmHg     Is BP treated: Yes     HDL Cholesterol: 41.8 mg/dL     Total Cholesterol: 257 mg/dL     Past Medical History:  Diagnosis Date   Bladder cancer ([place])    Renal mass    Past Surgical History:  Procedure Laterality Date   BLADDER TUMOR EXCISION  2019    COLONOSCOPY  2016   HEMORRHOID BANDING  11/2018   OPEN ANTERIOR SHOULDER RECONSTRUCTION Right 1980   Allergies  Allergen Reactions   Ace Inhibitors     Cough with both ace/arb   Current Meds  Medication Sig   amLODipine (NORVASC) 10 MG tablet TAKE ONE TABLET BY MOUTH EVERY DAY   hydrochlorothiazide (HYDRODIURIL) 25 MG tablet Take 1 tablet (25 mg total) by mouth daily.   Multiple Vitamin (MULTIVITAMIN ADULT PO) Take by mouth daily.   tadalafil (CIALIS) 5 MG tablet Take 1 tablet (5 mg total) by mouth daily as needed for erectile dysfunction.   Social History   Tobacco Use   Smoking status: Former    Types: Cigars, Cigarettes   Smokeless tobacco: Never  Substance Use Topics   Alcohol use: Not Currently   Family History  Problem Relation Age of Onset   High Cholesterol Mother    High blood pressure Mother    Cancer Father        melanoma   High blood pressure Father    Heart failure Father    High blood pressure Sister    High blood pressure Maternal Grandmother    Kidney disease Maternal Grandmother    Lung cancer Paternal Grandmother        smoker  COPD Paternal Grandmother    Lung cancer Paternal Grandfather        smoker   High blood pressure Son      Review of Systems  Constitutional:  Negative for activity change, appetite change, chills, fatigue, fever and unexpected weight change.  HENT:  Negative for congestion, ear pain, hearing loss, sinus pressure, sinus pain, sore throat and trouble swallowing.   Eyes:  Negative for pain and visual disturbance.  Respiratory:  Negative for cough, chest tightness, shortness of breath and wheezing.   Cardiovascular:  Negative for chest pain, palpitations and leg swelling.  Gastrointestinal:  Negative for abdominal distention, abdominal pain, blood in stool, constipation, diarrhea, nausea and vomiting.  Genitourinary:  Negative for decreased urine volume, difficulty urinating, dysuria, penile pain and testicular pain.   Musculoskeletal:  Negative for arthralgias, back pain and joint swelling.  Skin:  Negative for rash.  Neurological:  Negative for dizziness, weakness, numbness and headaches.  Hematological:  Negative for adenopathy. Does not bruise/bleed easily.  Psychiatric/Behavioral:  Negative for agitation, sleep disturbance and suicidal ideas. The patient is not nervous/anxious.    CBC:  Lab Results  Component Value Date   WBC 6.1 08/22/2021   HGB 15.2 08/22/2021   HCT 44.7 08/22/2021   MCH 29.3 06/23/2020   MCHC 34.0 08/22/2021   RDW 12.9 08/22/2021   PLT 245.0 08/22/2021   MPV 10.3 06/23/2020   CMP: Lab Results  Component Value Date   NA 137 08/22/2021   K 4.1 08/22/2021   CL 101 08/22/2021   CO2 26 08/22/2021   GLUCOSE 97 08/22/2021   BUN 10 08/22/2021   CREATININE 0.92 08/22/2021   CREATININE 0.95 06/23/2020   CALCIUM 9.8 08/22/2021   PROT 7.3 08/22/2021   BILITOT 0.7 08/22/2021   ALKPHOS 100 08/22/2021   ALT 17 08/22/2021   AST 17 08/22/2021   LIPID: Lab Results  Component Value Date   CHOL 254 (H) 12/24/2020   TRIG 218.0 (H) 12/24/2020   HDL 41.80 12/24/2020   LDLCALC 165 (H) 06/23/2020    Objective:  BP (!) 160/100 (BP Location: Left Arm, Patient Position: Sitting, Cuff Size: Large)    Pulse 91    Temp 97.7 F (36.5 C) (Oral)    Ht 6\' 1"  (1.854 m)    Wt 227 lb 12.8 oz (103.3 kg)    SpO2 98%    BMI 30.05 kg/m   Weight: 227 lb 12.8 oz (103.3 kg)   BP Readings from Last 3 Encounters:  12/27/21 (!) 160/100  11/16/21 (!) 160/93  08/22/21 (!) 142/90   Wt Readings from Last 3 Encounters:  12/27/21 227 lb 12.8 oz (103.3 kg)  11/16/21 220 lb (99.8 kg)  08/22/21 221 lb 3.2 oz (100.3 kg)    Physical Exam Constitutional:      General: He is not in acute distress.    Appearance: He is well-developed.  HENT:     Head: Normocephalic and atraumatic.     Right Ear: External ear normal.     Left Ear: External ear normal.     Nose: Nose normal.     Mouth/Throat:      Pharynx: No oropharyngeal exudate.  Eyes:     Conjunctiva/sclera: Conjunctivae normal.     Pupils: Pupils are equal, round, and reactive to light.  Neck:     Thyroid: No thyromegaly.  Cardiovascular:     Rate and Rhythm: Normal rate and regular rhythm.     Heart sounds: Normal heart sounds. No  murmur heard.   No friction rub. No gallop.  Pulmonary:     Effort: Pulmonary effort is normal. No respiratory distress.     Breath sounds: Normal breath sounds. No stridor. No wheezing or rales.  Abdominal:     General: Bowel sounds are normal.     Palpations: Abdomen is soft.  Musculoskeletal:        General: Normal range of motion.     Cervical back: Neck supple.  Skin:    General: Skin is warm and dry.     Comments: Brown, 39mm lesion scalp resembles seborrheic keratosis  Neurological:     Mental Status: He is alert and oriented to person, place, and time.  Psychiatric:        Behavior: Behavior normal.        Thought Content: Thought content normal.        Judgment: Judgment normal.    Assessment/Plan: There are no preventive care reminders to display for this patient.  Health Maintenance reviewed. He refuses statin.   1. Preventative health care Plans to start regular walking.   2. Primary hypertension Adding in hctz (fell off regular use) and continue amlodipine 10mg  daily.  3. Hyperlipidemia, unspecified hyperlipidemia type He kindly refuses statin. Worried doesn't feel well in past with this. No family members have tolerated. Wants to work on lifestyle.  4. Skin lesion of scalp We will biopsy this at next visit since I believe it will take him at least a few months to get in. - Ambulatory referral to Dermatology    Return in about 1 month (around 01/27/2022) for skin biopsy and bp recheck.  Micheline Rough, MD

## 2021-12-27 NOTE — Patient Instructions (Addendum)
Add back hydrochlorothiazide to amlodipine daily for better blood pressure control.   Monitor blood pressures at home and write down numbers/bring cuff to next visit. If your pressures are not regularly less than 135/85 in the next couple of weeks let me know.   Daily walking will help with both blood pressure and weight loss, cholesterol, blood sugar.

## 2021-12-27 NOTE — Telephone Encounter (Signed)
LVM for pt to call me back to schedule sleep study  

## 2022-01-03 ENCOUNTER — Encounter: Payer: Self-pay | Admitting: Family Medicine

## 2022-01-03 ENCOUNTER — Telehealth: Payer: Self-pay | Admitting: Neurology

## 2022-01-03 NOTE — Telephone Encounter (Signed)
We have attempted to call the patient 2 times to schedule sleep study. Patient has been unavailable at the phone numbers we have on file and has not returned our calls. If patient calls back we will schedule them for their sleep study. ° °

## 2022-01-26 ENCOUNTER — Encounter: Payer: Self-pay | Admitting: Family Medicine

## 2022-01-27 ENCOUNTER — Encounter: Payer: Self-pay | Admitting: Family Medicine

## 2022-01-27 ENCOUNTER — Ambulatory Visit (INDEPENDENT_AMBULATORY_CARE_PROVIDER_SITE_OTHER): Payer: 59 | Admitting: Family Medicine

## 2022-01-27 VITALS — BP 132/70 | HR 90 | Temp 98.1°F | Ht 73.0 in | Wt 225.0 lb

## 2022-01-27 DIAGNOSIS — M2550 Pain in unspecified joint: Secondary | ICD-10-CM | POA: Diagnosis not present

## 2022-01-27 DIAGNOSIS — R748 Abnormal levels of other serum enzymes: Secondary | ICD-10-CM

## 2022-01-27 DIAGNOSIS — E790 Hyperuricemia without signs of inflammatory arthritis and tophaceous disease: Secondary | ICD-10-CM

## 2022-01-27 DIAGNOSIS — T887XXA Unspecified adverse effect of drug or medicament, initial encounter: Secondary | ICD-10-CM | POA: Diagnosis not present

## 2022-01-27 DIAGNOSIS — N529 Male erectile dysfunction, unspecified: Secondary | ICD-10-CM

## 2022-01-27 DIAGNOSIS — I10 Essential (primary) hypertension: Secondary | ICD-10-CM | POA: Diagnosis not present

## 2022-01-27 DIAGNOSIS — R8290 Unspecified abnormal findings in urine: Secondary | ICD-10-CM

## 2022-01-27 LAB — URIC ACID: Uric Acid, Serum: 7.9 mg/dL — ABNORMAL HIGH (ref 4.0–7.8)

## 2022-01-27 LAB — SEDIMENTATION RATE: Sed Rate: 13 mm/h (ref 0–20)

## 2022-01-27 LAB — PSA: PSA: 0.66 ng/mL (ref 0.10–4.00)

## 2022-01-27 LAB — C-REACTIVE PROTEIN: CRP: <1 mg/dL (ref 0.5–20.0)

## 2022-01-27 LAB — CK: Total CK: 543 U/L — ABNORMAL HIGH (ref 7–232)

## 2022-01-27 LAB — TESTOSTERONE: Testosterone: 358.7 ng/dL (ref 300.00–890.00)

## 2022-01-27 MED ORDER — CIALIS 20 MG PO TABS: 20.0000 mg | ORAL_TABLET | Freq: Every day | ORAL | 5 refills | Status: DC | PRN

## 2022-01-27 NOTE — Progress Notes (Signed)
Christopher Salinas DOB: 1961/10/27 Encounter date: 01/27/2022  This is a 61 y.o. male who presents with Chief Complaint  Patient presents with   Follow-up    History of present illness: Patient was seen 1 month ago.  Asked to follow-up due to uncontrolled hypertension.  He was not using hydrochlorothiazide regularly so this was added back to his daily regimen.  Patient has refused statin although he has an elevated ASCVD score and NMR lipid profile most concerning for higher cardiovascular risk.  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 Will keep regular skin checks with them.   When he was here in January was drinking 4-7 sodas/day, sweet tea, candy, sweets, etc. Started cutting down pepsi to 1 or less, cut out sweet tea. Cut out sweets. Trying to eat healthier - more leafy greens and veggies. Started walking regularly - 8:30 every morning 40 minutes - meeting up with church friend. Not sure bp combo should be given all credit for improvement in pressure. A lot of side effects from medication. Doesn't like to take if getting side effects. Urinating very frequently with the hctz. He states that his cough is back (complained of cough on lisinopril but he is not taking lisinopril currently). Not sure if more related to acid reflux. Has some achiness/tenderness in knees, muscle groups. Knees hurt when walking down stairs. Was having issue with keeping erection on amlodipine, but with hctz he cannot achieve. Found old cialis 20mg  dose. Cut that in half and was able to achieve erection, but unable to keep.   During sexual activity, will feel like he is short of breath and heart racing. Recovers quickly - less than 15-20 minutes. Heart rate low 100's but bp looks ok after.  He does not get the same sensation when he is out walking.  He does a 40-minute walk that includes hills.  Shoulder and hip pain since last visit. Knee pain seems to come and go  somewhat.   Allergies  Allergen Reactions   Ace Inhibitors     Cough with both ace/arb   Current Meds  Medication Sig   amLODipine (NORVASC) 10 MG tablet TAKE ONE TABLET BY MOUTH EVERY DAY   CIALIS 20 MG tablet Take 1 tablet (20 mg total) by mouth daily as needed for erectile dysfunction.   fluconazole (DIFLUCAN) 200 MG tablet Take 200 mg by mouth once a week.   Multiple Vitamin (MULTIVITAMIN ADULT PO) Take by mouth daily.   [DISCONTINUED] hydrochlorothiazide (HYDRODIURIL) 25 MG tablet Take 1 tablet (25 mg total) by mouth daily.   [DISCONTINUED] tadalafil (CIALIS) 5 MG tablet Take 1 tablet (5 mg total) by mouth daily as needed for erectile dysfunction.    Review of Systems  Constitutional:  Negative for chills, fatigue and fever.  Respiratory:  Negative for cough, chest tightness, shortness of breath and wheezing.   Cardiovascular:  Negative for chest pain, palpitations and leg swelling.  Genitourinary:        Difficulty with maintaining erection and achieving orgasm.   Objective:  BP 132/70 (BP Location: Left Arm, Patient Position: Sitting, Cuff Size: Large)    Pulse 90    Temp 98.1 F (36.7 C) (Oral)    Ht 6\' 1"  (1.854 m)    Wt 225 lb (102.1 kg)    SpO2 95%    BMI 29.69 kg/m   Weight: 225 lb (102.1 kg)   BP Readings from Last 3 Encounters:  01/27/22 132/70  12/27/21 Marland Kitchen)  160/100  11/16/21 (!) 160/93   Wt Readings from Last 3 Encounters:  01/27/22 225 lb (102.1 kg)  12/27/21 227 lb 12.8 oz (103.3 kg)  11/16/21 220 lb (99.8 kg)    Physical Exam Constitutional:      General: He is not in acute distress.    Appearance: He is well-developed.  Cardiovascular:     Rate and Rhythm: Normal rate and regular rhythm.     Heart sounds: Normal heart sounds. No murmur heard.   No friction rub.  Pulmonary:     Effort: Pulmonary effort is normal. No respiratory distress.     Breath sounds: Normal breath sounds. No wheezing or rales.  Musculoskeletal:     Right lower leg: No  edema.     Left lower leg: No edema.  Neurological:     Mental Status: He is alert and oriented to person, place, and time.  Psychiatric:        Behavior: Behavior normal.    Assessment/Plan  1. Primary hypertension Blood pressure is looking much better with added hydrochlorothiazide.  However, he feels like he is urinating too much and worries about sexual side effects of medication.  He is happy to remain on this for the time being since blood pressures are looking better as we discussed and come up with alternative options for him in terms of treatment.  EKG is stable sinus rhythm without acute changes.  No prior for comparison. - EKG 12-Lead  2. Medication side effects Erectile side effects of blood pressure medications.  We discussed that uncontrolled blood pressure can also lead to sexual side effects.  He had cough with ACE and ARB.  We can try beta-blocker for blood pressure control since the diuretic also bothers him.  Consider this for the future.  We will continue with amlodipine 10 mg daily.  3. Erectile dysfunction, unspecified erectile dysfunction type See above.  Additionally, we are going to check testosterone levels.  He does better with namebrand Cialis versus the generic alternatives.  He does not seem to have as much racing heart or shortness of breath with sexual activity when he is using the name brand Cialis.  20 mg is really the only dose that is effective for him.  Cost is a barrier. - Testosterone; Future - PSA; Future - PSA - Testosterone  4. Arthralgia, unspecified joint - Sedimentation rate; Future - C-reactive Protein; Future - Uric acid; Future - ANA; Future - Rheumatoid factor; Future - CK - Rheumatoid factor - ANA - Uric acid - C-reactive Protein - Sedimentation rate   Return for pending lab or imaging results. 45 minutes spent in chart review, time with patient, discussion of concerns and medication side effects/options for medication change,  exam, follow-up plan.    Micheline Rough, MD

## 2022-01-29 ENCOUNTER — Other Ambulatory Visit: Payer: Self-pay | Admitting: Family Medicine

## 2022-01-29 LAB — ANA: Anti Nuclear Antibody (ANA): NEGATIVE

## 2022-01-29 LAB — RHEUMATOID FACTOR: Rheumatoid fact SerPl-aCnc: <14 IU/mL (ref <14)

## 2022-01-30 NOTE — Telephone Encounter (Signed)
Not sure if he has a [facility] nearby but I would ask them because they seem to be the cheapest. Not sure if there is a shortage or something (nothing came up on the FDA website when I searched) but I think they tend to carry them more often because people go to them because they are cheaper. Hope this helps!

## 2022-02-07 MED ORDER — TADALAFIL 20 MG PO TABS
10.0000 mg | ORAL_TABLET | ORAL | 11 refills | Status: DC | PRN
Start: 2022-02-07 — End: 2022-03-14

## 2022-02-07 MED ORDER — METOPROLOL SUCCINATE ER 25 MG PO TB24: 25.0000 mg | ORAL_TABLET | Freq: Every day | ORAL | 5 refills | Status: DC

## 2022-02-07 NOTE — Addendum Note (Signed)
Addended by: Caren Macadam on: 02/07/2022 11:43 AM   Modules accepted: Orders

## 2022-02-08 NOTE — Progress Notes (Signed)
Message reviewed and note sent through [facility]

## 2022-02-14 ENCOUNTER — Other Ambulatory Visit (INDEPENDENT_AMBULATORY_CARE_PROVIDER_SITE_OTHER): Payer: 59

## 2022-02-14 DIAGNOSIS — R8290 Unspecified abnormal findings in urine: Secondary | ICD-10-CM

## 2022-02-14 DIAGNOSIS — R748 Abnormal levels of other serum enzymes: Secondary | ICD-10-CM

## 2022-02-14 DIAGNOSIS — E790 Hyperuricemia without signs of inflammatory arthritis and tophaceous disease: Secondary | ICD-10-CM

## 2022-02-14 LAB — COMPREHENSIVE METABOLIC PANEL
ALT: 23 U/L (ref 0–53)
AST: 26 U/L (ref 0–37)
Albumin: 4.6 g/dL (ref 3.5–5.2)
Alkaline Phosphatase: 77 U/L (ref 39–117)
BUN: 15 mg/dL (ref 6–23)
CO2: 26 mEq/L (ref 19–32)
Calcium: 9.8 mg/dL (ref 8.4–10.5)
Chloride: 102 mEq/L (ref 96–112)
Creatinine, Ser: 1.13 mg/dL (ref 0.40–1.50)
GFR: 71.61 mL/min (ref >60.00)
Glucose, Bld: 95 mg/dL (ref 70–99)
Potassium: 4 mEq/L (ref 3.5–5.1)
Sodium: 136 mEq/L (ref 135–145)
Total Bilirubin: 0.6 mg/dL (ref 0.2–1.2)
Total Protein: 7.4 g/dL (ref 6.0–8.3)

## 2022-02-14 LAB — CBC WITH DIFFERENTIAL/PLATELET
Basophils Absolute: 0.1 10*3/uL (ref 0.0–0.1)
Basophils Relative: 0.7 % (ref 0.0–3.0)
Eosinophils Absolute: 0.2 10*3/uL (ref 0.0–0.7)
Eosinophils Relative: 2.8 % (ref 0.0–5.0)
HCT: 43.8 % (ref 39.0–52.0)
Hemoglobin: 15 g/dL (ref 13.0–17.0)
Lymphocytes Relative: 26.4 % (ref 12.0–46.0)
Lymphs Abs: 2 10*3/uL (ref 0.7–4.0)
MCHC: 34.2 g/dL (ref 30.0–36.0)
MCV: 86.2 fl (ref 78.0–100.0)
Monocytes Absolute: 0.7 10*3/uL (ref 0.1–1.0)
Monocytes Relative: 10 % (ref 3.0–12.0)
Neutro Abs: 4.5 10*3/uL (ref 1.4–7.7)
Neutrophils Relative %: 60.1 % (ref 43.0–77.0)
Platelets: 243 10*3/uL (ref 150.0–400.0)
RBC: 5.08 Mil/uL (ref 4.22–5.81)
RDW: 12.7 % (ref 11.5–15.5)
WBC: 7.5 10*3/uL (ref 4.0–10.5)

## 2022-02-14 LAB — CK: Total CK: 185 U/L (ref 7–232)

## 2022-02-14 LAB — URIC ACID: Uric Acid, Serum: 6.1 mg/dL (ref 4.0–7.8)

## 2022-02-15 LAB — URINALYSIS W MICROSCOPIC + REFLEX CULTURE
Bacteria, UA: NONE SEEN /HPF
Bilirubin Urine: NEGATIVE
Glucose, UA: NEGATIVE
Hgb urine dipstick: NEGATIVE
Hyaline Cast: NONE SEEN /LPF
Ketones, ur: NEGATIVE
Leukocyte Esterase: NEGATIVE
Nitrites, Initial: NEGATIVE
Protein, ur: NEGATIVE
RBC / HPF: NONE SEEN /HPF (ref 0–2)
Specific Gravity, Urine: 1.005 (ref 1.001–1.035)
Squamous Epithelial / HPF: NONE SEEN /HPF (ref <=5)
WBC, UA: NONE SEEN /HPF (ref 0–5)
pH: 7 (ref 5.0–8.0)

## 2022-02-15 LAB — NO CULTURE INDICATED

## 2022-03-09 ENCOUNTER — Encounter: Payer: Self-pay | Admitting: Family Medicine

## 2022-03-11 ENCOUNTER — Other Ambulatory Visit: Payer: Self-pay | Admitting: Family Medicine

## 2022-03-11 DIAGNOSIS — I10 Essential (primary) hypertension: Secondary | ICD-10-CM

## 2022-03-14 MED ORDER — METOPROLOL SUCCINATE ER 25 MG PO TB24: 25.0000 mg | ORAL_TABLET | Freq: Every day | ORAL | 1 refills | Status: DC

## 2022-03-14 MED ORDER — TADALAFIL 20 MG PO TABS: 10.0000 mg | ORAL_TABLET | ORAL | 5 refills | Status: DC | PRN

## 2022-08-15 ENCOUNTER — Ambulatory Visit (INDEPENDENT_AMBULATORY_CARE_PROVIDER_SITE_OTHER): Payer: 59 | Admitting: Family Medicine

## 2022-08-15 ENCOUNTER — Encounter: Payer: Self-pay | Admitting: Family Medicine

## 2022-08-15 VITALS — BP 136/80 | HR 95 | Temp 98.5°F | Resp 12 | Ht 73.0 in | Wt 217.5 lb

## 2022-08-15 DIAGNOSIS — G8929 Other chronic pain: Secondary | ICD-10-CM

## 2022-08-15 DIAGNOSIS — M25511 Pain in right shoulder: Secondary | ICD-10-CM | POA: Diagnosis not present

## 2022-08-15 DIAGNOSIS — E785 Hyperlipidemia, unspecified: Secondary | ICD-10-CM | POA: Diagnosis not present

## 2022-08-15 DIAGNOSIS — R1310 Dysphagia, unspecified: Secondary | ICD-10-CM

## 2022-08-15 DIAGNOSIS — I10 Essential (primary) hypertension: Secondary | ICD-10-CM | POA: Diagnosis not present

## 2022-08-15 MED ORDER — OMEPRAZOLE 40 MG PO CPDR
40.0000 mg | DELAYED_RELEASE_CAPSULE | Freq: Every day | ORAL | 3 refills | Status: DC
Start: 2022-08-15 — End: 2022-12-15

## 2022-08-15 MED ORDER — AMLODIPINE BESYLATE 10 MG PO TABS: 10.0000 mg | ORAL_TABLET | Freq: Every day | ORAL | 3 refills | Status: DC

## 2022-08-15 NOTE — Patient Instructions (Addendum)
A few things to remember from today's visit:  Chronic right shoulder pain  Primary hypertension - Plan: amLODipine (NORVASC) 10 MG tablet  Hyperlipidemia, unspecified hyperlipidemia type  Dysphagia, unspecified type - Plan: omeprazole (PRILOSEC) 40 MG capsule  If you need refills please call your pharmacy. Do not use My Chart to request refills or for acute issues that need immediate attention.   Please be sure medication list is accurate. If a new problem present, please set up appointment sooner than planned today. Omeprazole 40 mg 30 min before breakfast for 6-8 weeks. If swallowing problem does not resolve you will need an upper endoscopy.

## 2022-08-15 NOTE — Assessment & Plan Note (Addendum)
He is not interested in repeating FLP today. He will benefit from statin med for CV prevention, he does not want pharmacologic treatment at this time. Continue low fat diet. The 10-year ASCVD risk score (Arnett DK, et al., 2019) is: 14.7%   Values used to calculate the score:     Age: 61 years     Sex: Male     Is Non-Hispanic African American: No     Diabetic: No     Tobacco smoker: No     Systolic Blood Pressure: 959 mmHg     Is BP treated: Yes     HDL Cholesterol: 41.8 mg/dL     Total Cholesterol: 257 mg/dL
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
## 2022-08-15 NOTE — Assessment & Plan Note (Signed)
BP adequately controlled. Continue current management: Amlodipine 10 mg daily. DASH/low salt diet recommended. Continue monitoring BP at home. Periodic eye exam recommended.

## 2022-12-14 ENCOUNTER — Other Ambulatory Visit: Payer: Self-pay | Admitting: Family Medicine

## 2022-12-14 DIAGNOSIS — R1310 Dysphagia, unspecified: Secondary | ICD-10-CM

## 2022-12-29 ENCOUNTER — Encounter: Payer: 59 | Admitting: Family Medicine

## 2023-01-02 NOTE — Progress Notes (Unsigned)
HPI: Mr. Christopher Salinas is a 62 y.o.male with PMHx significant for HTN, HLD, hx of bladder cancer here today for his routine physical examination and follow up.  Last CPE: 12/27/21  He exercises regularly, walking two miles daily, and eats out often with his wife. Former [place] in 2003. He reports sleeping approximately 7 hours per night.  Immunization History  Administered Date(s) Administered   Hepatitis A, Adult 12/16/2018   Tdap 12/16/2018   Health Maintenance  Topic Date Due   HIV Screening  Never done   Hepatitis C Screening  Never done   COVID-19 Vaccine (1) 01/19/2023 (Originally 07/16/1966)   INFLUENZA VACCINE  03/11/2023 (Originally 07/11/2022)   Zoster Vaccines- Shingrix (1 of 2) 04/04/2023 (Originally [birth year]/16/1982)   COLONOSCOPY (Pts 45-7yr Insurance coverage will need to be confirmed)  12/17/2023   DTaP/Tdap/Td (2 - Td or Tdap) 12/16/2028   HPV VACCINES  Aged Out   Last prostate ca screening: PSA 0.6 in 01/2022. The patient reports rare nocturia denies gross hematuria. He is due for a urology appointment in August 2024.  -Concerns and/or follow up today:  He experiences right-sided weakness in his arm and leg, with the arm feeling weaker and causing pain upon movement. He also reports a sensation of walking on a wadded-up sock in the plantar area of his right foot, which has been present since this past summer.  Neck pain radiated to RUE, follows with chiropractor. He also has hx of lower back pain, not radiated.  Lab Results  Component Value Date   VITAMINB12 388 08/22/2021   HLD on non pharmacologic treatment. HTN on Amlodipine 10 mg daily.  Review of Systems  Constitutional:  Negative for activity change, appetite change and fever.  HENT:  Negative for nosebleeds, sore throat and trouble swallowing.   Eyes:  Negative for redness and visual disturbance.  Respiratory:  Negative for apnea, cough, shortness of breath and wheezing.   Cardiovascular:   Negative for chest pain, palpitations and leg swelling.  Gastrointestinal:  Negative for abdominal pain, blood in stool, nausea and vomiting.  Endocrine: Negative for cold intolerance, heat intolerance, polydipsia, polyphagia and polyuria.  Genitourinary:  Negative for decreased urine volume, dysuria, genital sores, hematuria and testicular pain.  Musculoskeletal:  Positive for arthralgias. Negative for myalgias.  Skin:  Negative for color change and rash.  Allergic/Immunologic: Negative for environmental allergies.  Neurological:  Negative for syncope and headaches.  Hematological:  Negative for adenopathy. Does not bruise/bleed easily.  Psychiatric/Behavioral:  Negative for confusion and hallucinations.    Current Outpatient Medications on File Prior to Visit  Medication Sig Dispense Refill   amLODipine (NORVASC) 10 MG tablet Take 1 tablet (10 mg total) by mouth daily. 90 tablet 3   fluconazole (DIFLUCAN) 200 MG tablet Take 200 mg by mouth once a week.     Multiple Vitamin (MULTIVITAMIN ADULT PO) Take by mouth daily.     omeprazole (PRILOSEC) 40 MG capsule TAKE 1 CAPSULE BY MOUTH EVERY DAY 30 capsule 3   tadalafil (CIALIS) 20 MG tablet Take 0.5-1 tablets (10-20 mg total) by mouth every other day as needed for erectile dysfunction. 20 tablet 5   No current facility-administered medications on file prior to visit.   Past Medical History:  Diagnosis Date   Bladder cancer ([facility]    Renal mass    Past Surgical History:  Procedure Laterality Date   BLADDER TUMOR EXCISION  2019   COLONOSCOPY  2016   HEMORRHOID BANDING  11/2018  OPEN ANTERIOR SHOULDER RECONSTRUCTION Right 1980   Allergies  Allergen Reactions   Ace Inhibitors     Cough with both ace/arb   Family History  Problem Relation Age of Onset   High Cholesterol Mother    High blood pressure Mother    Cancer Father        melanoma   High blood pressure Father    Heart failure Father    High blood pressure Sister    High  blood pressure Maternal Grandmother    Kidney disease Maternal Grandmother    Lung cancer Paternal Grandmother        smoker   COPD Paternal 43    Lung cancer Paternal Grandfather        smoker   High blood pressure Son     Social History   Socioeconomic History   Marital status: Married    Spouse name: anita   Number of children: 2   Years of education: Not on file   Highest education level: Master's degree (e.g., MA, MS, MEng, MEd, MSW, MBA)  Occupational History   Not on file  Tobacco Use   Smoking status: Former    Types: Cigars, Cigarettes    Quit date: 2003    Years since quitting: 21.0   Smokeless tobacco: Never  Substance and Sexual Activity   Alcohol use: Not Currently   Drug use: Not Currently   Sexual activity: Yes    Partners: Female  Other Topics Concern   Not on file  Social History Narrative   Lives with wife   Right handed   Caffeine: 10 cups of coffee in the morning and 1 soda a day   Social Determinants of Health   Financial Resource Strain: Not on file  Food Insecurity: Not on file  Transportation Needs: Not on file  Physical Activity: Not on file  Stress: Not on file  Social Connections: Not on file   Vitals:   01/03/23 0850  BP: 136/76  Resp: 16  Temp: 97.8 F (36.6 C)  Body mass index is 28.78 kg/m. Wt Readings from Last 3 Encounters:  01/03/23 218 lb 2 oz (98.9 kg)  08/15/22 217 lb 8 oz (98.7 kg)  01/27/22 225 lb (102.1 kg)   Physical Exam Vitals and nursing note reviewed.  Constitutional:      General: He is not in acute distress.    Appearance: He is well-developed.  HENT:     Head: Normocephalic and atraumatic.     Right Ear: Tympanic membrane, ear canal and external ear normal.     Left Ear: Tympanic membrane, ear canal and external ear normal.     Mouth/Throat:     Mouth: Mucous membranes are moist.     Pharynx: Oropharynx is clear.  Eyes:     Extraocular Movements: Extraocular movements intact.      Conjunctiva/sclera: Conjunctivae normal.     Pupils: Pupils are equal, round, and reactive to light.  Neck:     Thyroid: No thyromegaly.     Trachea: No tracheal deviation.  Cardiovascular:     Rate and Rhythm: Normal rate and regular rhythm.     Pulses:          Dorsalis pedis pulses are 2+ on the right side and 2+ on the left side.     Heart sounds: No murmur heard. Pulmonary:     Effort: Pulmonary effort is normal. No respiratory distress.     Breath sounds: Normal breath sounds.  Abdominal:  Palpations: Abdomen is soft. There is no hepatomegaly or mass.     Tenderness: There is no abdominal tenderness.  Genitourinary:    Comments: No concerns. Musculoskeletal:        General: No tenderness.     Cervical back: Normal range of motion.     Comments: No signs of synovitis.  Lymphadenopathy:     Cervical: No cervical adenopathy.     Upper Body:     Right upper body: No supraclavicular adenopathy.     Left upper body: No supraclavicular adenopathy.  Skin:    General: Skin is warm.     Findings: No erythema.  Neurological:     Mental Status: He is alert and oriented to person, place, and time.     Cranial Nerves: No cranial nerve deficit.     Sensory: No sensory deficit.     Gait: Gait normal.     Deep Tendon Reflexes:     Reflex Scores:      Bicep reflexes are 2+ on the right side and 2+ on the left side.      Patellar reflexes are 2+ on the right side and 2+ on the left side.    Comments: RUE strength 4/5, rest of 5/5.  Psychiatric:        Mood and Affect: Mood and affect normal.   ASSESSMENT AND PLAN:  [name] was seen today for annual exam.  Diagnoses and all orders for this visit:  Routine general medical examination at a health care facility  Hyperlipidemia, unspecified hyperlipidemia type -     Comprehensive metabolic panel; Future -     Lipid panel; Future  Primary hypertension -     TSH; Future -     CBC; Future  Paresthesia of right foot -      Vitamin B12; Future -     TSH; Future -     CBC; Future -     Ambulatory referral to Neurology   Orders Placed This Encounter  Procedures   Comprehensive metabolic panel   Lipid panel   Vitamin B12   TSH   CBC   Ambulatory referral to Neurology   Lab Results  Component Value Date   ALT 22 01/03/2023   AST 31 01/03/2023   ALKPHOS 109 01/03/2023   BILITOT 0.5 01/03/2023   Lab Results  Component Value Date   CHOL 231 (H) 01/03/2023   HDL 41.70 01/03/2023   LDLCALC 155 (H) 01/03/2023   LDLDIRECT 182.0 12/24/2020   TRIG 169.0 (H) 01/03/2023   CHOLHDL 6 01/03/2023   Lab Results  Component Value Date   CREATININE 0.87 01/03/2023   BUN 16 01/03/2023   NA 137 01/03/2023   K 3.9 01/03/2023   CL 102 01/03/2023   CO2 26 01/03/2023   Lab Results  Component Value Date   TSH 0.69 01/03/2023   Lab Results  Component Value Date   WBC 6.8 01/03/2023   HGB 15.7 01/03/2023   HCT 45.9 01/03/2023   MCV 86.6 01/03/2023   PLT 233.0 01/03/2023   Lab Results  Component Value Date   VITAMINB12 514 01/03/2023  The 10-year ASCVD risk score (Arnett DK, et al., 2019) is: 13.4%   Values used to calculate the score:     Age: 27 years     Sex: Male     Is Non-Hispanic African American: No     Diabetic: No     Tobacco smoker: No     Systolic Blood Pressure: 024  mmHg     Is BP treated: Yes     HDL Cholesterol: 41.7 mg/dL     Total Cholesterol: 231 mg/dL  Routine general medical examination at a health care facility Assessment & Plan: We discussed the importance of regular physical activity and healthy diet for prevention of chronic illness and/or complications. Preventive guidelines reviewed. Vaccination: Declined. Next CPE in a year.   Hyperlipidemia, unspecified hyperlipidemia type Assessment & Plan: Non pharmacologic treatment recommended for now. Further recommendations will be given according to 10 years CVD risk score and lipid panel numbers.  Orders: -      Comprehensive metabolic panel; Future -     Lipid panel; Future  Primary hypertension Assessment & Plan: BP adequately controlled. Continue current management: Amlodipine 10 mg daily. DASH/low salt diet recommended.  Orders: -     TSH; Future -     CBC; Future  Paresthesia of right foot Assessment & Plan: With associated right-sided weakness. Neuro examination does not suggest a serious process. Slight weakness RUE that could be caused but chronic shoulder pain. ? Peripheral neuropathy and radicular pain are some to consider. We discussed options, including cervical MRI. He prefers to have neuro consultation, he is concerned about serious process.  Orders: -     Vitamin B12; Future -     TSH; Future -     CBC; Future -     Ambulatory referral to Neurology   Return in 1 year (on 01/04/2024).  Amada Hallisey G. [place], MD  [facility] [place] office.

## 2023-01-03 ENCOUNTER — Encounter: Payer: Self-pay | Admitting: Family Medicine

## 2023-01-03 ENCOUNTER — Ambulatory Visit (INDEPENDENT_AMBULATORY_CARE_PROVIDER_SITE_OTHER): Payer: 59 | Admitting: Family Medicine

## 2023-01-03 VITALS — BP 136/76 | Temp 97.8°F | Resp 16 | Ht 73.0 in | Wt 218.1 lb

## 2023-01-03 DIAGNOSIS — Z1159 Encounter for screening for other viral diseases: Secondary | ICD-10-CM | POA: Diagnosis not present

## 2023-01-03 DIAGNOSIS — R202 Paresthesia of skin: Secondary | ICD-10-CM

## 2023-01-03 DIAGNOSIS — I10 Essential (primary) hypertension: Secondary | ICD-10-CM

## 2023-01-03 DIAGNOSIS — E785 Hyperlipidemia, unspecified: Secondary | ICD-10-CM

## 2023-01-03 DIAGNOSIS — Z0000 Encounter for general adult medical examination without abnormal findings: Secondary | ICD-10-CM | POA: Diagnosis not present

## 2023-01-03 LAB — CBC
HCT: 45.9 % (ref 39.0–52.0)
Hemoglobin: 15.7 g/dL (ref 13.0–17.0)
MCHC: 34.2 g/dL (ref 30.0–36.0)
MCV: 86.6 fl (ref 78.0–100.0)
Platelets: 233 10*3/uL (ref 150.0–400.0)
RBC: 5.3 Mil/uL (ref 4.22–5.81)
RDW: 12.6 % (ref 11.5–15.5)
WBC: 6.8 10*3/uL (ref 4.0–10.5)

## 2023-01-03 LAB — COMPREHENSIVE METABOLIC PANEL
ALT: 22 U/L (ref 0–53)
AST: 31 U/L (ref 0–37)
Albumin: 4.7 g/dL (ref 3.5–5.2)
Alkaline Phosphatase: 109 U/L (ref 39–117)
BUN: 16 mg/dL (ref 6–23)
CO2: 26 mEq/L (ref 19–32)
Calcium: 9.5 mg/dL (ref 8.4–10.5)
Chloride: 102 mEq/L (ref 96–112)
Creatinine, Ser: 0.87 mg/dL (ref 0.40–1.50)
GFR: 94.47 mL/min (ref >60.00)
Glucose, Bld: 99 mg/dL (ref 70–99)
Potassium: 3.9 mEq/L (ref 3.5–5.1)
Sodium: 137 mEq/L (ref 135–145)
Total Bilirubin: 0.5 mg/dL (ref 0.2–1.2)
Total Protein: 7.7 g/dL (ref 6.0–8.3)

## 2023-01-03 LAB — LIPID PANEL
Cholesterol: 231 mg/dL — ABNORMAL HIGH (ref 0–200)
HDL: 41.7 mg/dL (ref >39.00)
LDL Cholesterol: 155 mg/dL — ABNORMAL HIGH (ref 0–99)
NonHDL: 188.91
Total CHOL/HDL Ratio: 6
Triglycerides: 169 mg/dL — ABNORMAL HIGH (ref 0.0–149.0)
VLDL: 33.8 mg/dL (ref 0.0–40.0)

## 2023-01-03 LAB — VITAMIN B12: Vitamin B-12: 514 pg/mL (ref 211–911)

## 2023-01-03 LAB — TSH: TSH: 0.69 u[IU]/mL (ref 0.35–5.50)

## 2023-01-03 NOTE — Patient Instructions (Addendum)
A few things to remember from today's visit:  Routine general medical examination at a health care facility  Hyperlipidemia, unspecified hyperlipidemia type - Plan: Comprehensive metabolic panel, Lipid panel  Primary hypertension - Plan: TSH, CBC  Paresthesia of right foot - Plan: Vitamin B12, TSH, CBC, Ambulatory referral to Neurology  If you need refills for medications you take chronically, please call your pharmacy. Do not use My Chart to request refills or for acute issues that need immediate attention. If you send a my chart message, it may take a few days to be addressed, specially if I am not in the office.  Please be sure medication list is accurate. If a new problem present, please set up appointment sooner than planned today.  Health Maintenance, Male Adopting a healthy lifestyle and getting preventive care are important in promoting health and wellness. Ask your health care provider about: The right schedule for you to have regular tests and exams. Things you can do on your own to prevent diseases and keep yourself healthy. What should I know about diet, weight, and exercise? Eat a healthy diet  Eat a diet that includes plenty of vegetables, fruits, low-fat dairy products, and lean protein. Do not eat a lot of foods that are high in solid fats, added sugars, or sodium. Maintain a healthy weight Body mass index (BMI) is a measurement that can be used to identify possible weight problems. It estimates body fat based on height and weight. Your health care provider can help determine your BMI and help you achieve or maintain a healthy weight. Get regular exercise Get regular exercise. This is one of the most important things you can do for your health. Most adults should: Exercise for at least 150 minutes each week. The exercise should increase your heart rate and make you sweat (moderate-intensity exercise). Do strengthening exercises at least twice a week. This is in addition  to the moderate-intensity exercise. Spend less time sitting. Even light physical activity can be beneficial. Watch cholesterol and blood lipids Have your blood tested for lipids and cholesterol at 60 years of age, then have this test every 5 years. You may need to have your cholesterol levels checked more often if: Your lipid or cholesterol levels are high. You are older than 60 years of age. You are at high risk for heart disease. What should I know about cancer screening? Many types of cancers can be detected early and may often be prevented. Depending on your health history and family history, you may need to have cancer screening at various ages. This may include screening for: Colorectal cancer. Prostate cancer. Skin cancer. Lung cancer. What should I know about heart disease, diabetes, and high blood pressure? Blood pressure and heart disease High blood pressure causes heart disease and increases the risk of stroke. This is more likely to develop in people who have high blood pressure readings or are overweight. Talk with your health care provider about your target blood pressure readings. Have your blood pressure checked: Every 3-5 years if you are 71-53 years of age. Every year if you are 42 years old or older. If you are between the ages of 8 and 26 and are a current or former smoker, ask your health care provider if you should have a one-time screening for abdominal aortic aneurysm (AAA). Diabetes Have regular diabetes screenings. This checks your fasting blood sugar level. Have the screening done: Once every three years after age 24 if you are at a normal weight  and have a low risk for diabetes. More often and at a younger age if you are overweight or have a high risk for diabetes. What should I know about preventing infection? Hepatitis B If you have a higher risk for hepatitis B, you should be screened for this virus. Talk with your health care provider to find out if you  are at risk for hepatitis B infection. Hepatitis C Blood testing is recommended for: Everyone born from [birth year] through 1963. Anyone with known risk factors for hepatitis C. Sexually transmitted infections (STIs) You should be screened each year for STIs, including gonorrhea and chlamydia, if: You are sexually active and are younger than 60 years of age. You are older than 60 years of age and your health care provider tells you that you are at risk for this type of infection. Your sexual activity has changed since you were last screened, and you are at increased risk for chlamydia or gonorrhea. Ask your health care provider if you are at risk. Ask your health care provider about whether you are at high risk for HIV. Your health care provider may recommend a prescription medicine to help prevent HIV infection. If you choose to take medicine to prevent HIV, you should first get tested for HIV. You should then be tested every 3 months for as long as you are taking the medicine. Follow these instructions at home: Alcohol use Do not drink alcohol if your health care provider tells you not to drink. If you drink alcohol: Limit how much you have to 0-2 drinks a day. Know how much alcohol is in your drink. In the [place] one drink equals one 12 oz bottle of beer (355 mL), one 5 oz glass of wine (148 mL), or one 1 oz glass of hard liquor (44 mL). Lifestyle Do not use any products that contain nicotine or tobacco. These products include cigarettes, chewing tobacco, and vaping devices, such as e-cigarettes. If you need help quitting, ask your health care provider. Do not use street drugs. Do not share needles. Ask your health care provider for help if you need support or information about quitting drugs. General instructions Schedule regular health, dental, and eye exams. Stay current with your vaccines. Tell your health care provider if: You often feel depressed. You have ever been abused or do not feel  safe at home. Summary Adopting a healthy lifestyle and getting preventive care are important in promoting health and wellness. Follow your health care provider's instructions about healthy diet, exercising, and getting tested or screened for diseases. Follow your health care provider's instructions on monitoring your cholesterol and blood pressure. This information is not intended to replace advice given to you by your health care provider. Make sure you discuss any questions you have with your health care provider. Document Revised: 04/18/2021 Document Reviewed: 04/18/2021 Elsevier Patient Education  [place].

## 2023-01-04 DIAGNOSIS — Z0000 Encounter for general adult medical examination without abnormal findings: Secondary | ICD-10-CM | POA: Insufficient documentation

## 2023-01-04 NOTE — Assessment & Plan Note (Signed)
We discussed the importance of regular physical activity and healthy diet for prevention of chronic illness and/or complications. Preventive guidelines reviewed. Vaccination: Declined. Next CPE in a year.

## 2023-01-04 NOTE — Assessment & Plan Note (Signed)
BP adequately controlled. Continue current management: Amlodipine 10 mg daily. DASH/low salt diet recommended.

## 2023-01-04 NOTE — Assessment & Plan Note (Signed)
With associated right-sided weakness. Neuro examination does not suggest a serious process. Slight weakness RUE that could be caused but chronic shoulder pain. ? Peripheral neuropathy and radicular pain are some to consider. We discussed options, including cervical MRI. He prefers to have neuro consultation, he is concerned about serious process.

## 2023-01-04 NOTE — Assessment & Plan Note (Signed)
Non pharmacologic treatment recommended for now. Further recommendations will be given according to 10 years CVD risk score and lipid panel numbers. 

## 2023-01-05 ENCOUNTER — Other Ambulatory Visit: Payer: Self-pay

## 2023-01-05 DIAGNOSIS — I10 Essential (primary) hypertension: Secondary | ICD-10-CM

## 2023-01-05 DIAGNOSIS — R1310 Dysphagia, unspecified: Secondary | ICD-10-CM

## 2023-01-05 MED ORDER — OMEPRAZOLE 40 MG PO CPDR: 40.0000 mg | DELAYED_RELEASE_CAPSULE | Freq: Every day | ORAL | 3 refills | Status: DC

## 2023-01-05 MED ORDER — TADALAFIL 20 MG PO TABS: 10.0000 mg | ORAL_TABLET | ORAL | 12 refills | Status: DC | PRN

## 2023-01-05 MED ORDER — AMLODIPINE BESYLATE 10 MG PO TABS: 10.0000 mg | ORAL_TABLET | Freq: Every day | ORAL | 3 refills | Status: DC

## 2023-02-06 ENCOUNTER — Ambulatory Visit (INDEPENDENT_AMBULATORY_CARE_PROVIDER_SITE_OTHER): Payer: 59 | Admitting: Neurology

## 2023-02-06 ENCOUNTER — Encounter: Payer: Self-pay | Admitting: Neurology

## 2023-02-06 ENCOUNTER — Telehealth: Payer: Self-pay | Admitting: Neurology

## 2023-02-06 VITALS — BP 137/74 | HR 66 | Ht 73.0 in | Wt 215.0 lb

## 2023-02-06 DIAGNOSIS — R29898 Other symptoms and signs involving the musculoskeletal system: Secondary | ICD-10-CM | POA: Insufficient documentation

## 2023-02-06 DIAGNOSIS — M50122 Cervical disc disorder at C5-C6 level with radiculopathy: Secondary | ICD-10-CM | POA: Diagnosis not present

## 2023-02-06 DIAGNOSIS — M542 Cervicalgia: Secondary | ICD-10-CM | POA: Insufficient documentation
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
## 2023-02-06 NOTE — Telephone Encounter (Signed)
[employer] sent to [facility] they obtain auth [id number]

## 2023-02-22 ENCOUNTER — Ambulatory Visit: Payer: 59 | Admitting: Neurology

## 2023-03-05 ENCOUNTER — Ambulatory Visit
Admission: RE | Admit: 2023-03-05 | Discharge: 2023-03-05 | Disposition: A | Discharge status: Home / Self Care | Payer: 59 | Source: Ambulatory Visit | Attending: Neurology | Admitting: Neurology

## 2023-03-05 DIAGNOSIS — M50122 Cervical disc disorder at C5-C6 level with radiculopathy: Secondary | ICD-10-CM | POA: Diagnosis not present

## 2023-03-05 DIAGNOSIS — R29898 Other symptoms and signs involving the musculoskeletal system: Secondary | ICD-10-CM

## 2023-03-05 MED ORDER — GADOPICLENOL 0.5 MMOL/ML IV SOLN
10.0000 mL | Freq: Once | INTRAVENOUS | Status: AC | PRN
  Administered 2023-03-05: 10 mL via INTRAVENOUS

## 2023-03-07 ENCOUNTER — Encounter: Payer: Self-pay | Admitting: Neurology

## 2023-03-29 ENCOUNTER — Ambulatory Visit (INDEPENDENT_AMBULATORY_CARE_PROVIDER_SITE_OTHER): Payer: 59 | Admitting: Neurology

## 2023-03-29 VITALS — BP 128/76 | HR 76 | Ht 73.0 in | Wt 215.0 lb

## 2023-03-29 DIAGNOSIS — R29898 Other symptoms and signs involving the musculoskeletal system: Secondary | ICD-10-CM | POA: Diagnosis not present

## 2023-03-29 DIAGNOSIS — M50122 Cervical disc disorder at C5-C6 level with radiculopathy: Secondary | ICD-10-CM

## 2023-03-29 DIAGNOSIS — G8929 Other chronic pain: Secondary | ICD-10-CM

## 2023-03-29 DIAGNOSIS — M25511 Pain in right shoulder: Secondary | ICD-10-CM | POA: Diagnosis not present

## 2023-03-29 DIAGNOSIS — M25611 Stiffness of right shoulder, not elsewhere classified: Secondary | ICD-10-CM | POA: Diagnosis not present

## 2023-03-29 DIAGNOSIS — G5601 Carpal tunnel syndrome, right upper limb: Secondary | ICD-10-CM | POA: Diagnosis not present

## 2023-03-29 DIAGNOSIS — M67911 Unspecified disorder of synovium and tendon, right shoulder: Secondary | ICD-10-CM

## 2023-03-29 NOTE — Progress Notes (Signed)
Patient is here for emg/ncs. Right hand goes numb. Has neck stiffness but denies radiation from his cervical spine into the arm or hand but does have right (points to the traps and deltoids) soreness. Cannot more right arm above shoulder only straight out 90 degrees and also has weakness in rotator cuff muscles like external rotation. Left arm for 40 years has left arm weakness from prior injury  but no significant changes no cervical radicular symptoms.  I reviewed with patient, EMG showed right mild to moderate carpal tunnel syndrome and old changes that would correspond to his cervical degenerative changes but no active denervation to suspect current ongoing radicular symptoms, likely remote.  Suspect right shoulder and right rotator cuff may need further examining. Cannot actively lift right arm above 90 degrees and hurts to actively rotate at the shoulder with weak right external rotation suspect this is more his shoulder than cervical radiculopathy recommend imaging to further examine.    Discussed the following with patient:   Conclusion: 1. There is electrophysiologic evidence of mild to moderate right Carpal Tunnel Syndrome.  No suggestion of polyneuropathy or current radiculopathy or ulnar neuropathy at the elbow or wrist.   2. There was no active/ongoing denervation on emg needle study to suggest current ongoing radiculopathy. There were chronic neurogenic changes likely from remote radiculopathy in muscles that share c6-c7 innervation c/w his MRI but no active denervation at this time so likely remote 3. Suggest MRI right shoulder and rotator cuff given symptoms as above, as clinically warranted.  After consultation with Dr. Vickey Salinas who is his primary doctor, we agreed to the following plan: Send to [facility] for right CTS Right shoulder MRI  Orders Placed This Encounter  Procedures   MR SHOULDER RIGHT WO CONTRAST   Ambulatory referral to Hand Surgery   I spent 20 minutes  of face-to-face and non-face-to-face time with patient on the  1. Right arm weakness   2. Carpal tunnel syndrome of right wrist   3. Chronic right shoulder pain   4. Decreased range of motion of right shoulder   5. Disorder of right rotator cuff   6. 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  This included previsit chart review, lab review, study review, order entry, electronic health record documentation, patient education on the different diagnostic and therapeutic options, counseling and coordination of care, risks and benefits of management, compliance, or risk factor reduction.  This does not include time an EMG nerve conduction study.

## 2023-04-01 NOTE — Progress Notes (Signed)
Full Name: Christopher Salinas Gender: Male MRN #: [id number] Date of Birth: November 24, 1961    Visit Date: 03/29/2023 13:11 Age: 62 Years Examining Physician: Dr. Naomie Dean Referring Physician: Dr. Porfirio Mylar Dohmeier Height: 6 feet 1 inch   History: Patient is here for emg/ncs right arm pain. Right hand goes numb. Has neck stiffness but denies cervical radiation into the arm or hand but does have right (points to the traps and shoulder) soreness. Cannot more right arm above shoulder only straight out 90 degrees. Left arm for 40 years has left arm weakness points to left chest area from prior injury  but no significant changes no cervical radicular symptoms on left. He has cervical radicular degenerative changes. He can raise his right arm only 90 degrees not above the shoulder with weakness in external rotation and hurts to move shoulder.      Summary:   Nerve Conduction Studies were performed on the bilateral upper extremities.  The right Median 2nd Digit orthodromic sensory nerve showed prolonged distal peak latency (3.6 ms, N<3.4) and reduced amplitude(5 uV, N>10) The right median/ulnar (palm) comparison nerve showed prolonged distal peak latency (Median Palm, 2.8 ms, N<2.2) and abnormal peak latency difference (Median Palm-Ulnar Palm, 0.9 ms, N<0.4) with a relative median delay.    The right deltoid, right brachioradialis, right and left flexor carpi radialis all showed polyphasic motor units, prolonged motor unit duration and diminished motor unit recruitment.  No acute/ongoing denervation was seen.   All remaining nerves and remaining muscles (as indicated in the following tables) were within normal limits.       Conclusion: 1. There is electrophysiologic evidence of mild to moderate right Carpal Tunnel Syndrome.  No suggestion of polyneuropathy or current radiculopathy or ulnar neuropathy at the elbow or wrist.   2. There was no active/ongoing denervation on emg needle study to  suggest current ongoing radiculopathy. There were chronic neurogenic changes likely from remote radiculopathy in muscles that share c6-c7 innervation c/w his MRI but no active denervation at this time so likely remote 3. Suggest MRI right shoulder and rotator cuff given symptoms, as clinically warranted.    ------------------------------- Naomie Dean, M.D.  [facility] [address] [place], [place] [address] Tel: [phone] Fax: [phone]  Verbal informed consent was obtained from the patient, patient was informed of potential risk of procedure, including bruising, bleeding, hematoma formation, infection, muscle weakness, muscle pain, numbness, among others.        MNC    Nerve / Sites Muscle Latency Ref. Amplitude Ref. Rel Amp Segments Distance Velocity Ref. Area    ms ms mV mV %  cm m/s m/s mVms  R Median - APB     Wrist APB 4.0 ?4.4 7.5 ?4.0 100 Wrist - APB 7   31.1     Upper arm APB 8.5  7.4  99.6 Upper arm - Wrist 28 62 ?49 31.1  L Median - APB     Wrist APB 3.4 ?4.4 5.9 ?4.0 100 Wrist - APB 7   22.2     Upper arm APB 8.4  6.3  106 Upper arm - Wrist 27 54 ?49 23.2  R Ulnar - ADM     Wrist ADM 2.6 ?3.3 10.2 ?6.0 100 Wrist - ADM 7   40.7     B.Elbow ADM 4.9  9.2  89.6 B.Elbow - Wrist 14 61 ?49 37.4     A.Elbow ADM 7.7  9.5  104 A.Elbow -  B.Elbow 17 61 ?49 39.9  L Ulnar - ADM     Wrist ADM 2.5 ?3.3 8.5 ?6.0 100 Wrist - ADM 7   27.1     B.Elbow ADM 4.5  8.0  93.4 B.Elbow - Wrist 12 58 ?49 26.5     A.Elbow ADM 7.6  6.9  87.1 A.Elbow - B.Elbow 17 55 ?49 22.9     Median abve elbow ADM 7.8  5.9  85 Median abve elbow - ADM    24.8         A.Elbow - Wrist      R Ulnar - FDI     Wrist FDI 4.3 ?4.5 7.3 ?7.0 100 Wrist - FDI 8   16.5     B.Elbow FDI 6.6  7.0  95.2 B.Elbow - Wrist 13 57 ?49 15.7     A.Elbow FDI 9.3  6.9  99.1 A.Elbow - B.Elbow 19.6 71 ?49 16.2     Above elbow FDI 4.0  7.6  111 Axilla - A.Elbow    16.8  palm FDI 3.5  7.1  92.6  Supraclav fossa - Axilla    17.8         A.Elbow - Wrist                     SNC    Nerve / Sites Rec. Site Peak Lat Ref.  Amp Ref. Segments Distance Peak Diff Ref.    ms ms V V  cm ms ms  R Radial - Anatomical snuff box (Forearm)     Forearm Wrist 2.4 ?2.9 15 ?15 Forearm - Wrist 10    L Radial - Anatomical snuff box (Forearm)     Forearm Wrist 2.4 ?2.9 39 ?15 Forearm - Wrist 10    R Median, Ulnar - Transcarpal comparison     Median Palm Wrist 2.8 ?2.2 37 ?35 Median Palm - Wrist 8       Ulnar Palm Wrist 1.9 ?2.2 5 ?12 Ulnar Palm - Wrist 8          Median Palm - Ulnar Palm  0.9 ?0.4  L Median, Ulnar - Transcarpal comparison     Median Palm Wrist 2.3 ?2.2 48 ?35 Median Palm - Wrist 8       Ulnar Palm Wrist 2.1 ?2.2 6 ?12 Ulnar Palm - Wrist 8          Median Palm - Ulnar Palm  0.2 ?0.4  R Median - Orthodromic (Dig II, Mid palm)     Dig II Wrist 3.6 ?3.4 5 ?10 Dig II - Wrist 13    L Median - Orthodromic (Dig II, Mid palm)     Dig II Wrist 3.4 ?3.4 15 ?10 Dig II - Wrist 13    R Ulnar - Orthodromic, (Dig V, Mid palm)     Dig V Wrist 3.0 ?3.1 8 ?5 Dig V - Wrist 11    L Ulnar - Orthodromic, (Dig V, Mid palm)     Dig V Wrist 2.9 ?3.1 5 ?5 Dig V - Wrist 43                           F  Wave    Nerve F Lat Ref.   ms ms  R Ulnar - ADM 27.9 ?32.0  L Ulnar - ADM 28.1 ?32.0         EMG Summary Table    Spontaneous MUAP Recruitment  Muscle IA Fib PSW Fasc Other Amp Dur. Poly Pattern  R. Cervical paraspinals (low) Normal None None None _______ Normal Normal Normal Normal  R. Deltoid Normal None None None _______ Normal Normal Normal Normal  R. Triceps brachii Normal None None None _______ Normal Normal Normal Normal  R. Pronator teres Normal None None None _______ Normal Normal Normal Normal  R. First dorsal interosseous Normal None None None _______ Normal Normal Normal Normal  R. Opponens pollicis Normal None None None _______ Normal Normal Normal Normal  R. Infraspinatus Normal None  None None _______ Normal Normal Normal Normal  R. Brachioradialis Normal None None None _______ Normal increased 2+ Reduced  R. Flexor carpi radialis Normal None None None _______ Normal Increased 3+ Reduced  R. Extensor digitorum communis Normal None None None _______ Normal Normal Normal Reduced  R. Biceps brachii Normal None None None _______ Normal Increased 3+ Reduced  L. Cervical paraspinals (low) Normal None None None _______ Normal Normal Normal Normal  L. Triceps brachii Normal None None None _______ Normal Normal Normal Normal  L. Pronator teres Normal None None None _______ Normal Normal Normal Reduced  L. First dorsal interosseous Normal None None None _______ Normal Normal Normal Normal  L. Infraspinatus Normal None None None _______ Normal Normal Normal Normal  L. Brachioradialis Normal None None None _______ Normal Increased 2+ Reduced  L. Flexor carpi radialis Normal None None None _______ Normal Normal Normal Normal  L. Extensor digitorum communis Normal None None None _______ Normal Normal Normal Normal  L. Biceps brachii Normal None None None _______ Normal Normal Normal Normal

## 2023-04-08 NOTE — Procedures (Signed)
Full Name: Christopher Salinas Gender: Male MRN #: [id number] Date of Birth: November 24, 1961    Visit Date: 03/29/2023 13:11 Age: 62 Years Examining Physician: Dr. Naomie Dean Referring Physician: Dr. Porfirio Mylar Dohmeier Height: 6 feet 1 inch   History: Patient is here for emg/ncs right arm pain. Right hand goes numb. Has neck stiffness but denies cervical radiation into the arm or hand but does have right (points to the traps and shoulder) soreness. Cannot more right arm above shoulder only straight out 90 degrees. Left arm for 40 years has left arm weakness points to left chest area from prior injury  but no significant changes no cervical radicular symptoms on left. He has cervical radicular degenerative changes. He can raise his right arm only 90 degrees not above the shoulder with weakness in external rotation and hurts to move shoulder.      Summary:   Nerve Conduction Studies were performed on the bilateral upper extremities.  The right Median 2nd Digit orthodromic sensory nerve showed prolonged distal peak latency (3.6 ms, N<3.4) and reduced amplitude(5 uV, N>10) The right median/ulnar (palm) comparison nerve showed prolonged distal peak latency (Median Palm, 2.8 ms, N<2.2) and abnormal peak latency difference (Median Palm-Ulnar Palm, 0.9 ms, N<0.4) with a relative median delay.    The right deltoid, right brachioradialis, right and left flexor carpi radialis all showed polyphasic motor units, prolonged motor unit duration and diminished motor unit recruitment.  No acute/ongoing denervation was seen.   All remaining nerves and remaining muscles (as indicated in the following tables) were within normal limits.       Conclusion: 1. There is electrophysiologic evidence of mild to moderate right Carpal Tunnel Syndrome.  No suggestion of polyneuropathy or current radiculopathy or ulnar neuropathy at the elbow or wrist.   2. There was no active/ongoing denervation on emg needle study to  suggest current ongoing radiculopathy. There were chronic neurogenic changes likely from remote radiculopathy in muscles that share c6-c7 innervation c/w his MRI but no active denervation at this time so likely remote 3. Suggest MRI right shoulder and rotator cuff given symptoms, as clinically warranted.    ------------------------------- Naomie Dean, M.D.  [facility] [address] [place], [place] [address] Tel: [phone] Fax: [phone]  Verbal informed consent was obtained from the patient, patient was informed of potential risk of procedure, including bruising, bleeding, hematoma formation, infection, muscle weakness, muscle pain, numbness, among others.        MNC    Nerve / Sites Muscle Latency Ref. Amplitude Ref. Rel Amp Segments Distance Velocity Ref. Area    ms ms mV mV %  cm m/s m/s mVms  R Median - APB     Wrist APB 4.0 ?4.4 7.5 ?4.0 100 Wrist - APB 7   31.1     Upper arm APB 8.5  7.4  99.6 Upper arm - Wrist 28 62 ?49 31.1  L Median - APB     Wrist APB 3.4 ?4.4 5.9 ?4.0 100 Wrist - APB 7   22.2     Upper arm APB 8.4  6.3  106 Upper arm - Wrist 27 54 ?49 23.2  R Ulnar - ADM     Wrist ADM 2.6 ?3.3 10.2 ?6.0 100 Wrist - ADM 7   40.7     B.Elbow ADM 4.9  9.2  89.6 B.Elbow - Wrist 14 61 ?49 37.4     A.Elbow ADM 7.7  9.5  104 A.Elbow -  B.Elbow 17 61 ?49 39.9  L Ulnar - ADM     Wrist ADM 2.5 ?3.3 8.5 ?6.0 100 Wrist - ADM 7   27.1     B.Elbow ADM 4.5  8.0  93.4 B.Elbow - Wrist 12 58 ?49 26.5     A.Elbow ADM 7.6  6.9  87.1 A.Elbow - B.Elbow 17 55 ?49 22.9     Median abve elbow ADM 7.8  5.9  85 Median abve elbow - ADM    24.8         A.Elbow - Wrist      R Ulnar - FDI     Wrist FDI 4.3 ?4.5 7.3 ?7.0 100 Wrist - FDI 8   16.5     B.Elbow FDI 6.6  7.0  95.2 B.Elbow - Wrist 13 57 ?49 15.7     A.Elbow FDI 9.3  6.9  99.1 A.Elbow - B.Elbow 19.6 71 ?49 16.2     Above elbow FDI 4.0  7.6  111 Axilla - A.Elbow    16.8  palm FDI 3.5  7.1  92.6  Supraclav fossa - Axilla    17.8         A.Elbow - Wrist                     SNC    Nerve / Sites Rec. Site Peak Lat Ref.  Amp Ref. Segments Distance Peak Diff Ref.    ms ms V V  cm ms ms  R Radial - Anatomical snuff box (Forearm)     Forearm Wrist 2.4 ?2.9 15 ?15 Forearm - Wrist 10    L Radial - Anatomical snuff box (Forearm)     Forearm Wrist 2.4 ?2.9 39 ?15 Forearm - Wrist 10    R Median, Ulnar - Transcarpal comparison     Median Palm Wrist 2.8 ?2.2 37 ?35 Median Palm - Wrist 8       Ulnar Palm Wrist 1.9 ?2.2 5 ?12 Ulnar Palm - Wrist 8          Median Palm - Ulnar Palm  0.9 ?0.4  L Median, Ulnar - Transcarpal comparison     Median Palm Wrist 2.3 ?2.2 48 ?35 Median Palm - Wrist 8       Ulnar Palm Wrist 2.1 ?2.2 6 ?12 Ulnar Palm - Wrist 8          Median Palm - Ulnar Palm  0.2 ?0.4  R Median - Orthodromic (Dig II, Mid palm)     Dig II Wrist 3.6 ?3.4 5 ?10 Dig II - Wrist 13    L Median - Orthodromic (Dig II, Mid palm)     Dig II Wrist 3.4 ?3.4 15 ?10 Dig II - Wrist 13    R Ulnar - Orthodromic, (Dig V, Mid palm)     Dig V Wrist 3.0 ?3.1 8 ?5 Dig V - Wrist 11    L Ulnar - Orthodromic, (Dig V, Mid palm)     Dig V Wrist 2.9 ?3.1 5 ?5 Dig V - Wrist 43                           F  Wave    Nerve F Lat Ref.   ms ms  R Ulnar - ADM 27.9 ?32.0  L Ulnar - ADM 28.1 ?32.0         EMG Summary Table    Spontaneous MUAP Recruitment  Muscle IA Fib PSW Fasc Other Amp Dur. Poly Pattern  R. Cervical paraspinals (low) Normal None None None _______ Normal Normal Normal Normal  R. Deltoid Normal None None None _______ Normal Normal Normal Normal  R. Triceps brachii Normal None None None _______ Normal Normal Normal Normal  R. Pronator teres Normal None None None _______ Normal Normal Normal Normal  R. First dorsal interosseous Normal None None None _______ Normal Normal Normal Normal  R. Opponens pollicis Normal None None None _______ Normal Normal Normal Normal  R. Infraspinatus Normal None  None None _______ Normal Normal Normal Normal  R. Brachioradialis Normal None None None _______ Normal increased 2+ Reduced  R. Flexor carpi radialis Normal None None None _______ Normal Increased 3+ Reduced  R. Extensor digitorum communis Normal None None None _______ Normal Normal Normal Reduced  R. Biceps brachii Normal None None None _______ Normal Increased 3+ Reduced  L. Cervical paraspinals (low) Normal None None None _______ Normal Normal Normal Normal  L. Triceps brachii Normal None None None _______ Normal Normal Normal Normal  L. Pronator teres Normal None None None _______ Normal Normal Normal Reduced  L. First dorsal interosseous Normal None None None _______ Normal Normal Normal Normal  L. Infraspinatus Normal None None None _______ Normal Normal Normal Normal  L. Brachioradialis Normal None None None _______ Normal Increased 2+ Reduced  L. Flexor carpi radialis Normal None None None _______ Normal Normal Normal Normal  L. Extensor digitorum communis Normal None None None _______ Normal Normal Normal Normal  L. Biceps brachii Normal None None None _______ Normal Normal Normal Normal

## 2023-04-09 ENCOUNTER — Telehealth: Payer: Self-pay | Admitting: Neurology

## 2023-04-09 NOTE — Telephone Encounter (Signed)
sent to [facility] they obtain [employer] auth [id number] 

## 2023-04-09 NOTE — Telephone Encounter (Signed)
Hand surgery referral faxed to [facility] (fax# [phone], phone# [phone]) 

## 2023-04-10 ENCOUNTER — Encounter: Payer: Self-pay | Admitting: Neurology

## 2023-06-04 ENCOUNTER — Other Ambulatory Visit: Payer: Self-pay

## 2023-06-04 DIAGNOSIS — G5600 Carpal tunnel syndrome, unspecified upper limb: Secondary | ICD-10-CM

## 2023-08-30 ENCOUNTER — Encounter: Payer: Self-pay | Admitting: Family Medicine

## 2023-08-30 ENCOUNTER — Ambulatory Visit (INDEPENDENT_AMBULATORY_CARE_PROVIDER_SITE_OTHER): Payer: 59 | Admitting: Family Medicine

## 2023-08-30 VITALS — BP 149/85 | HR 84 | Ht 73.0 in | Wt 214.8 lb

## 2023-08-30 DIAGNOSIS — N529 Male erectile dysfunction, unspecified: Secondary | ICD-10-CM

## 2023-08-30 DIAGNOSIS — G5600 Carpal tunnel syndrome, unspecified upper limb: Secondary | ICD-10-CM

## 2023-08-30 DIAGNOSIS — I10 Essential (primary) hypertension: Secondary | ICD-10-CM | POA: Diagnosis not present

## 2023-08-30 DIAGNOSIS — E785 Hyperlipidemia, unspecified: Secondary | ICD-10-CM | POA: Diagnosis not present

## 2023-08-30 NOTE — Assessment & Plan Note (Signed)
-   discussed with patient to try and cut him medicine in half to 5mg  tablets and take that for 2 weeks while checking bp - have him return and we will see if htn has element of white coat or he actually needs to be on 10mg . Pt says when he takes the 10mg  he has some symptoms of lightheadedness so he alternates

## 2023-08-30 NOTE — Patient Instructions (Signed)
Take your blood pressure 3-4 times over the next two weeks

## 2023-08-30 NOTE — Assessment & Plan Note (Signed)
-   sees urology

## 2023-08-30 NOTE — Progress Notes (Signed)
New patient visit   Patient: Christopher Salinas   DOB: 03/29/1961   62 y.o. Male  MRN: [id number] Visit Date: 08/30/2023  Today's healthcare provider: Charlton Amor, DO   Chief Complaint  Patient presents with   New Patient (Initial Visit)    SUBJECTIVE    Chief Complaint  Patient presents with   New Patient (Initial Visit)   HPI   Pt presents to establish care. He is "doctor shopping" and has questions today about style of practice.   Pmh of HTN - on amlodipine 10mg  but sometimes takes 5mg  due to bp running low  Hx of Vit D deficiency  - not on any supplement   Carpal tunnel - has been having bilateral hand pain and was diagnosed with carpal tunnel   Review of Systems  Constitutional:  Negative for activity change, fatigue and fever.  Respiratory:  Negative for cough and shortness of breath.   Cardiovascular:  Negative for chest pain.  Gastrointestinal:  Negative for abdominal pain.  Genitourinary:  Negative for difficulty urinating.       Current Meds  Medication Sig   amLODipine (NORVASC) 10 MG tablet Take 1 tablet (10 mg total) by mouth daily.   Multiple Vitamin (MULTIVITAMIN ADULT PO) Take by mouth daily.   omeprazole (PRILOSEC) 40 MG capsule Take 1 capsule (40 mg total) by mouth daily.   tadalafil (CIALIS) 20 MG tablet Take 0.5-1 tablets (10-20 mg total) by mouth every other day as needed for erectile dysfunction.    OBJECTIVE    BP (!) 149/85   Pulse 84   Ht 6\' 1"  (1.854 m)   Wt 214 lb 12 oz (97.4 kg)   SpO2 96%   BMI 28.33 kg/m   Physical Exam Vitals and nursing note reviewed.  Constitutional:      General: He is not in acute distress.    Appearance: Normal appearance.  HENT:     Head: Normocephalic and atraumatic.     Right Ear: External ear normal.     Left Ear: External ear normal.     Nose: Nose normal.  Eyes:     Conjunctiva/sclera: Conjunctivae normal.  Cardiovascular:     Rate and Rhythm: Normal rate and regular rhythm.   Pulmonary:     Effort: Pulmonary effort is normal.     Breath sounds: Normal breath sounds.  Neurological:     General: No focal deficit present.     Mental Status: He is alert and oriented to person, place, and time.  Psychiatric:        Mood and Affect: Mood normal.        Behavior: Behavior normal.        Thought Content: Thought content normal.        Judgment: Judgment normal.        ASSESSMENT & PLAN    Problem List Items Addressed This Visit       Cardiovascular and Mediastinum   Hypertension - Primary    - discussed with patient to try and cut him medicine in half to 5mg  tablets and take that for 2 weeks while checking bp - have him return and we will see if htn has element of white coat or he actually needs to be on 10mg . Pt says when he takes the 10mg  he has some symptoms of lightheadedness so he alternates         Nervous and Auditory   Carpal tunnel syndrome    -  recommend night splints and home exercises or PT for 6-8 weeks - says it happened while cycling so I am wondering if there is an aspect of cubital tunnel syndrome going on        Other   Hyperlipidemia    Pt says he does not tolerate statins. Says they make him tired and he doesn't want to be on them - at next visit would be worth it to check a lipid panel       Erectile dysfunction    - sees urology      Needs: CBC, BMP, Vit D, lipid  Greater than was spent with patient in addition to chart prep and charting  Return in about 2 weeks (around 09/13/2023) for HTN follow up.      No orders of the defined types were placed in this encounter.   No orders of the defined types were placed in this encounter.    Charlton Amor, DO  [facility] at [facility] [phone] (phone) [phone] (fax)  [facility]

## 2023-08-30 NOTE — Assessment & Plan Note (Signed)
-   recommend night splints and home exercises or PT for 6-8 weeks - says it happened while cycling so I am wondering if there is an aspect of cubital tunnel syndrome going on

## 2023-08-30 NOTE — Assessment & Plan Note (Signed)
Pt says he does not tolerate statins. Says they make him tired and he doesn't want to be on them - at next visit would be worth it to check a lipid panel

## 2023-09-13 ENCOUNTER — Ambulatory Visit (INDEPENDENT_AMBULATORY_CARE_PROVIDER_SITE_OTHER): Payer: 59 | Admitting: Family Medicine

## 2023-09-13 ENCOUNTER — Encounter: Payer: Self-pay | Admitting: Family Medicine

## 2023-09-13 VITALS — BP 120/81 | HR 82 | Ht 73.0 in | Wt 214.0 lb

## 2023-09-13 DIAGNOSIS — I10 Essential (primary) hypertension: Secondary | ICD-10-CM

## 2023-09-13 MED ORDER — AMLODIPINE BESYLATE 5 MG PO TABS: 5.0000 mg | ORAL_TABLET | Freq: Every day | ORAL | 2 refills | Status: DC

## 2023-09-13 NOTE — Progress Notes (Signed)
Established patient visit   Patient: Christopher Salinas   DOB: 1961/09/14   62 y.o. Male  MRN: [id number] Visit Date: 09/13/2023  Today's healthcare provider: Charlton Amor, DO   Chief Complaint  Patient presents with   Medical Management of Chronic Issues    HTN    SUBJECTIVE    Chief Complaint  Patient presents with   Medical Management of Chronic Issues    HTN   HPI HPI     Medical Management of Chronic Issues    Additional comments: HTN      Last edited by Roselyn Reef, CMA on 09/13/2023 10:12 AM.       Pt presents for HTN follow up. He is on amlodipine 5mg  and doing well. Home readings are within normal range. On arrival his home cuff and our cuff do show elevated blood pressure.   Review of Systems  Constitutional:  Negative for activity change, fatigue and fever.  Respiratory:  Negative for cough and shortness of breath.   Cardiovascular:  Negative for chest pain.  Gastrointestinal:  Negative for abdominal pain.  Genitourinary:  Negative for difficulty urinating.       Current Meds  Medication Sig   amLODipine (NORVASC) 5 MG tablet Take 1 tablet (5 mg total) by mouth daily.   Multiple Vitamin (MULTIVITAMIN ADULT PO) Take by mouth daily.   omeprazole (PRILOSEC) 40 MG capsule Take 1 capsule (40 mg total) by mouth daily.   tadalafil (CIALIS) 20 MG tablet Take 0.5-1 tablets (10-20 mg total) by mouth every other day as needed for erectile dysfunction.   [DISCONTINUED] amLODipine (NORVASC) 10 MG tablet Take 1 tablet (10 mg total) by mouth daily.    OBJECTIVE    BP 120/81 Comment: pt bought in his reading from his cuff  Pulse 82   Ht 6\' 1"  (1.854 m)   Wt 214 lb (97.1 kg)   SpO2 99%   BMI 28.23 kg/m   Physical Exam Vitals and nursing note reviewed.  Constitutional:      General: He is not in acute distress.    Appearance: Normal appearance.  HENT:     Head: Normocephalic and atraumatic.     Right Ear: External ear normal.     Left Ear:  External ear normal.     Nose: Nose normal.  Eyes:     Conjunctiva/sclera: Conjunctivae normal.  Cardiovascular:     Rate and Rhythm: Normal rate and regular rhythm.  Pulmonary:     Effort: Pulmonary effort is normal.     Breath sounds: Normal breath sounds.  Neurological:     General: No focal deficit present.     Mental Status: He is alert and oriented to person, place, and time.  Psychiatric:        Mood and Affect: Mood normal.        Behavior: Behavior normal.        Thought Content: Thought content normal.        Judgment: Judgment normal.        ASSESSMENT & PLAN    Problem List Items Addressed This Visit       Cardiovascular and Mediastinum   Hypertension - Primary    - sent in new prescription of amlodipine 5mg   - follow up for wellness at next visit where we will get labs - do believe pt has an element of white coat hypertension due to discrepancy from blood pressure from home and in office  Relevant Medications   amLODipine (NORVASC) 5 MG tablet    Return in about 4 months (around 01/14/2024) for wellness.      Meds ordered this encounter  Medications   amLODipine (NORVASC) 5 MG tablet    Sig: Take 1 tablet (5 mg total) by mouth daily.    Dispense:  90 tablet    Refill:  2    No orders of the defined types were placed in this encounter.    Charlton Amor, DO  [facility] Medicine at [facility] [phone] (phone) [phone] (fax)  [facility]

## 2023-09-13 NOTE — Assessment & Plan Note (Signed)
-   sent in new prescription of amlodipine 5mg   - follow up for wellness at next visit where we will get labs - do believe pt has an element of white coat hypertension due to discrepancy from blood pressure from home and in office

## 2023-12-31 ENCOUNTER — Encounter: Payer: Self-pay | Admitting: Family Medicine

## 2023-12-31 ENCOUNTER — Ambulatory Visit (INDEPENDENT_AMBULATORY_CARE_PROVIDER_SITE_OTHER): Payer: 59 | Admitting: Family Medicine

## 2023-12-31 VITALS — BP 160/72 | HR 76 | Ht 73.0 in | Wt 223.2 lb

## 2023-12-31 DIAGNOSIS — K2100 Gastro-esophageal reflux disease with esophagitis, without bleeding: Secondary | ICD-10-CM | POA: Diagnosis not present

## 2023-12-31 DIAGNOSIS — I10 Essential (primary) hypertension: Secondary | ICD-10-CM | POA: Diagnosis not present

## 2023-12-31 DIAGNOSIS — Z0000 Encounter for general adult medical examination without abnormal findings: Secondary | ICD-10-CM | POA: Diagnosis not present

## 2023-12-31 DIAGNOSIS — Z125 Encounter for screening for malignant neoplasm of prostate: Secondary | ICD-10-CM | POA: Diagnosis not present

## 2023-12-31 MED ORDER — AMLODIPINE BESYLATE 5 MG PO TABS
5.0000 mg | ORAL_TABLET | Freq: Every day | ORAL | 2 refills | Status: DC
Start: 2023-12-31 — End: 2024-05-13

## 2023-12-31 MED ORDER — OMEPRAZOLE 40 MG PO CPDR
40.0000 mg | DELAYED_RELEASE_CAPSULE | Freq: Every day | ORAL | 3 refills | Status: DC
Start: 2023-12-31 — End: 2024-06-10

## 2023-12-31 NOTE — Assessment & Plan Note (Signed)
Pt needs med refill  Follow up 6 months for HTN check

## 2023-12-31 NOTE — Assessment & Plan Note (Signed)
-   pt presents for wellness exam today. He is doing well. Denies any concerns. Will go ahead and get appropriate labs today - have gone ahead and ordered PSA. Pt denies nocturia.

## 2023-12-31 NOTE — Progress Notes (Signed)
Established patient visit   Patient: Christopher Salinas   DOB: 1961/09/20   63 y.o. Male  MRN: [id number] Visit Date: 12/31/2023  Today's healthcare provider: Charlton Amor, DO   Chief Complaint  Patient presents with   Annual Exam    SUBJECTIVE    Chief Complaint  Patient presents with   Annual Exam   HPI  Pt presents or wellness exam. Has no concerns today  Did get a colonoscopy in [place] and will find out the place and let us know.   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 He is able to manage his stress appropriately and denies any depression or anxiety.    Review of Systems  Constitutional:  Negative for activity change, fatigue and fever.  Respiratory:  Negative for cough and shortness of breath.   Cardiovascular:  Negative for chest pain.  Gastrointestinal:  Negative for abdominal pain.  Genitourinary:  Negative for difficulty urinating.       Current Meds  Medication Sig   amLODipine (NORVASC) 5 MG tablet Take 1 tablet (5 mg total) by mouth daily.   Multiple Vitamin (MULTIVITAMIN ADULT PO) Take by mouth daily.   omeprazole (PRILOSEC) 40 MG capsule Take 1 capsule (40 mg total) by mouth daily.   tadalafil (CIALIS) 20 MG tablet Take 0.5-1 tablets (10-20 mg total) by mouth every other day as needed for erectile dysfunction.   [DISCONTINUED] amLODipine (NORVASC) 5 MG tablet Take 1 tablet (5 mg total) by mouth daily.   [DISCONTINUED] omeprazole (PRILOSEC) 40 MG capsule Take 1 capsule (40 mg total) by mouth daily.    OBJECTIVE    BP (!) 160/72 (BP Location: Left Arm, Patient Position: Sitting, Cuff Size: Large)   Pulse 76   Ht 6\' 1"  (1.854 m)   Wt 223 lb 4 oz (101.3 kg)   SpO2 99%   BMI 29.45 kg/m   Physical Exam Vitals and nursing note reviewed.  Constitutional:      General: He is not in acute distress.    Appearance: Normal appearance.  HENT:     Head: Normocephalic and atraumatic.      Right Ear: External ear normal.     Left Ear: External ear normal.     Nose: Nose normal.  Eyes:     Conjunctiva/sclera: Conjunctivae normal.  Cardiovascular:     Rate and Rhythm: Normal rate and regular rhythm.  Pulmonary:     Effort: Pulmonary effort is normal.     Breath sounds: Normal breath sounds.  Abdominal:     General: Abdomen is flat. Bowel sounds are normal.     Palpations: Abdomen is soft.     Tenderness: There is no abdominal tenderness.  Neurological:     General: No focal deficit present.     Mental Status: He is alert and oriented to person, place, and time.  Psychiatric:        Mood and Affect: Mood normal.        Behavior: Behavior normal.        Thought Content: Thought content normal.        Judgment: Judgment normal.        ASSESSMENT & PLAN    Problem List Items Addressed This Visit       Cardiovascular and Mediastinum   Hypertension   Pt needs med refill  Follow up 6 months for HTN check  Relevant Medications   amLODipine (NORVASC) 5 MG tablet     Other   Routine adult health maintenance - Primary   - pt presents for wellness exam today. He is doing well. Denies any concerns. Will go ahead and get appropriate labs today - have gone ahead and ordered PSA. Pt denies nocturia.       Relevant Orders   CBC   Basic Metabolic Panel (BMET)   Lipid panel   Other Visit Diagnoses       Prostate cancer screening       Relevant Orders   PSA     Gastroesophageal reflux disease with esophagitis without hemorrhage       Relevant Medications   omeprazole (PRILOSEC) 40 MG capsule       Return in about 2 weeks (around 01/14/2024) for nurse visit HTN check.      Meds ordered this encounter  Medications   amLODipine (NORVASC) 5 MG tablet    Sig: Take 1 tablet (5 mg total) by mouth daily.    Dispense:  90 tablet    Refill:  2   omeprazole (PRILOSEC) 40 MG capsule    Sig: Take 1 capsule (40 mg total) by mouth daily.    Dispense:  90 capsule     Refill:  3    Orders Placed This Encounter  Procedures   CBC   Basic Metabolic Panel (BMET)    Has the patient fasted?:   No   Lipid panel    Has the patient fasted?:   No    Release to patient:   Immediate   PSA     Charlton Amor, DO  [facility] Medicine at [facility] [phone] (phone) [phone] (fax)  [facility]

## 2024-01-01 ENCOUNTER — Encounter: Payer: Self-pay | Admitting: Family Medicine

## 2024-01-01 LAB — CBC
Hematocrit: 46.5 % (ref 37.5–51.0)
Hemoglobin: 15.7 g/dL (ref 13.0–17.7)
MCH: 30.2 pg (ref 26.6–33.0)
MCHC: 33.8 g/dL (ref 31.5–35.7)
MCV: 89 fL (ref 79–97)
Platelets: 232 10*3/uL (ref 150–450)
RBC: 5.2 x10E6/uL (ref 4.14–5.80)
RDW: 12.3 % (ref 11.6–15.4)
WBC: 6.6 10*3/uL (ref 3.4–10.8)

## 2024-01-01 LAB — LIPID PANEL
Chol/HDL Ratio: 6.4 {ratio} — ABNORMAL HIGH (ref 0.0–5.0)
Cholesterol, Total: 268 mg/dL — ABNORMAL HIGH (ref 100–199)
HDL: 42 mg/dL (ref >39)
LDL Chol Calc (NIH): 192 mg/dL — ABNORMAL HIGH (ref 0–99)
Triglycerides: 179 mg/dL — ABNORMAL HIGH (ref 0–149)
VLDL Cholesterol Cal: 34 mg/dL (ref 5–40)

## 2024-01-01 LAB — BASIC METABOLIC PANEL
BUN/Creatinine Ratio: 14 (ref 10–24)
BUN: 14 mg/dL (ref 8–27)
CO2: 21 mmol/L (ref 20–29)
Calcium: 9.5 mg/dL (ref 8.6–10.2)
Chloride: 100 mmol/L (ref 96–106)
Creatinine, Ser: 1.01 mg/dL (ref 0.76–1.27)
Glucose: 115 mg/dL — ABNORMAL HIGH (ref 70–99)
Potassium: 4.1 mmol/L (ref 3.5–5.2)
Sodium: 139 mmol/L (ref 134–144)
eGFR: 85 mL/min/{1.73_m2} (ref >59)

## 2024-01-01 LAB — PSA: Prostate Specific Ag, Serum: 0.7 ng/mL (ref 0.0–4.0)

## 2024-01-14 ENCOUNTER — Ambulatory Visit (INDEPENDENT_AMBULATORY_CARE_PROVIDER_SITE_OTHER): Payer: 59 | Admitting: Family Medicine

## 2024-01-14 VITALS — BP 140/88 | HR 68 | Temp 98.1°F | Ht 73.0 in | Wt 229.0 lb

## 2024-01-14 DIAGNOSIS — I10 Essential (primary) hypertension: Secondary | ICD-10-CM

## 2024-01-14 NOTE — Progress Notes (Signed)
Patient is here for blood pressure check. Denies trouble sleeping, palpitations, dizziness, lightheadedness, blurry vision, chest pain, shortness of breath, headaches and/or medication problems.   Patient's blood pressure was out of goal range 162/92, pulse 68. Patient sat for 15 minutes. Blood pressure recheck was 140/88, pulse 68.  Patient was informed to schedule a follow up appointment with provider in 3 mths. Patient declined and stated he will wait until he is due for his physical. Patient informed to schedule next appointment as needed.

## 2024-04-17 ENCOUNTER — Encounter: Payer: Self-pay | Admitting: Family Medicine

## 2024-05-13 ENCOUNTER — Encounter (INDEPENDENT_AMBULATORY_CARE_PROVIDER_SITE_OTHER): Payer: Self-pay | Admitting: Family Medicine

## 2024-05-13 ENCOUNTER — Other Ambulatory Visit: Payer: Self-pay | Admitting: *Deleted

## 2024-05-13 DIAGNOSIS — I10 Essential (primary) hypertension: Secondary | ICD-10-CM | POA: Diagnosis not present

## 2024-05-13 MED ORDER — AMLODIPINE BESYLATE 10 MG PO TABS
10.0000 mg | ORAL_TABLET | Freq: Every day | ORAL | 0 refills | Status: DC
Start: 2024-05-13 — End: 2024-05-13

## 2024-05-13 MED ORDER — AMLODIPINE BESYLATE 10 MG PO TABS: 10.0000 mg | ORAL_TABLET | Freq: Every day | ORAL | 3 refills | Status: DC

## 2024-05-13 NOTE — Telephone Encounter (Signed)

## 2024-05-13 NOTE — Telephone Encounter (Signed)
 Spoke with patient.  Informed of Dr. Dianah Fort leaving the practice He states he would have difficulty getting in for  a follow up visit at the moment due to work requirements.  He is asking for refills on the prescription - States his BP has averaged between 134-140. Yesterday's reading 120/70

## 2024-05-13 NOTE — Addendum Note (Signed)
 Addended by: Gean Keels on: 05/13/2024 03:22 PM   Modules accepted: Orders

## 2024-06-09 ENCOUNTER — Ambulatory Visit: Payer: Self-pay

## 2024-06-09 NOTE — Telephone Encounter (Signed)
 Pt scheduled to see me tomorrow 06/10/24.

## 2024-06-09 NOTE — Telephone Encounter (Signed)
 FYI Only or Action Required?: FYI only for provider.  Patient was last seen in primary care on 01/14/2024 by Bevin Bernice RAMAN, DO. Called Nurse Triage reporting No chief complaint on file.. Symptoms began several months ago. Interventions attempted: Nothing. Symptoms are: gradually worsening.  Triage Disposition: See PCP When Office is Open (Within 3 Days)  Patient/caregiver understands and will follow disposition?: Yes   Copied from CRM [id number]. Topic: Clinical - Red Word Triage >> Jun 09, 2024 11:26 AM Merlynn LABOR wrote: Red Word that prompted transfer to Nurse Triage: Right foot swelling/numbness Reason for Disposition  [1] Numbness or tingling in one or both feet AND [2] is a chronic symptom (recurrent or ongoing AND present > 4 weeks)  Answer Assessment - Initial Assessment Questions 1. SYMPTOM: What is the main symptom you are concerned about? (e.g., weakness, numbness)     Right foot, numbness  2. ONSET: When did this start? (minutes, hours, days; while sleeping)     Chronic  3. LAST NORMAL: When was the last time you (the patient) were normal (no symptoms)?     Months ago  4. PATTERN Does this come and go, or has it been constant since it started?  Is it present now?     Constant since start  5. CARDIAC SYMPTOMS: Have you had any of the following symptoms: chest pain, difficulty breathing, palpitations?     No  6. NEUROLOGIC SYMPTOMS: Have you had any of the following symptoms: headache, dizziness, vision loss, double vision, changes in speech, unsteady on your feet?     No  7. OTHER SYMPTOMS: Do you have any other symptoms?     No  Protocols used: Neurologic Deficit-A-AH

## 2024-06-10 ENCOUNTER — Ambulatory Visit

## 2024-06-10 ENCOUNTER — Ambulatory Visit: Payer: Self-pay | Admitting: Urgent Care

## 2024-06-10 ENCOUNTER — Ambulatory Visit (INDEPENDENT_AMBULATORY_CARE_PROVIDER_SITE_OTHER): Admitting: Urgent Care

## 2024-06-10 ENCOUNTER — Encounter: Payer: Self-pay | Admitting: Urgent Care

## 2024-06-10 VITALS — BP 150/83 | HR 92 | Resp 18 | Ht 73.0 in | Wt 226.0 lb

## 2024-06-10 DIAGNOSIS — R7309 Other abnormal glucose: Secondary | ICD-10-CM

## 2024-06-10 DIAGNOSIS — K2100 Gastro-esophageal reflux disease with esophagitis, without bleeding: Secondary | ICD-10-CM | POA: Diagnosis not present

## 2024-06-10 DIAGNOSIS — E790 Hyperuricemia without signs of inflammatory arthritis and tophaceous disease: Secondary | ICD-10-CM | POA: Diagnosis not present

## 2024-06-10 DIAGNOSIS — R748 Abnormal levels of other serum enzymes: Secondary | ICD-10-CM | POA: Diagnosis not present

## 2024-06-10 DIAGNOSIS — R202 Paresthesia of skin: Secondary | ICD-10-CM

## 2024-06-10 DIAGNOSIS — G5791 Unspecified mononeuropathy of right lower limb: Secondary | ICD-10-CM | POA: Diagnosis not present

## 2024-06-10 DIAGNOSIS — N529 Male erectile dysfunction, unspecified: Secondary | ICD-10-CM

## 2024-06-10 DIAGNOSIS — M4726 Other spondylosis with radiculopathy, lumbar region: Secondary | ICD-10-CM

## 2024-06-10 MED ORDER — OMEPRAZOLE 40 MG PO CPDR: 40.0000 mg | DELAYED_RELEASE_CAPSULE | Freq: Every day | ORAL | 3 refills | Status: DC

## 2024-06-10 MED ORDER — TADALAFIL 20 MG PO TABS: 10.0000 mg | ORAL_TABLET | ORAL | 12 refills | Status: DC | PRN

## 2024-06-10 NOTE — Patient Instructions (Addendum)
 We drew labs to assess cause of your symptoms. We refilled your medications.  Please go to [address] and get an xray of your back.  Schedule your annual physical in January
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
## 2024-06-13 LAB — URIC ACID: Uric Acid: 5.5 mg/dL (ref 3.8–8.4)

## 2024-06-13 LAB — HEMOGLOBIN A1C
Est. average glucose Bld gHb Est-mCnc: 111 mg/dL
Hgb A1c MFr Bld: 5.5 % (ref 4.8–5.6)

## 2024-06-13 LAB — B12 AND FOLATE PANEL
Folate: 6.8 ng/mL (ref >3.0)
Vitamin B-12: 377 pg/mL (ref 232–1245)

## 2024-06-13 LAB — CK: Total CK: 156 U/L (ref 41–331)

## 2024-06-13 LAB — VITAMIN B1: Thiamine: 109.8 nmol/L (ref 66.5–200.0)

## 2024-06-17 ENCOUNTER — Ambulatory Visit

## 2024-06-17 DIAGNOSIS — M4726 Other spondylosis with radiculopathy, lumbar region: Secondary | ICD-10-CM | POA: Diagnosis not present

## 2024-06-19 ENCOUNTER — Ambulatory Visit: Payer: Self-pay | Admitting: Urgent Care

## 2024-10-15 ENCOUNTER — Encounter: Payer: Self-pay | Admitting: Urgent Care

## 2024-12-23 ENCOUNTER — Encounter: Payer: Self-pay | Admitting: Urgent Care

## 2024-12-23 ENCOUNTER — Ambulatory Visit (INDEPENDENT_AMBULATORY_CARE_PROVIDER_SITE_OTHER): Admitting: Urgent Care

## 2024-12-23 VITALS — BP 138/80 | HR 66 | Ht 73.0 in | Wt 224.0 lb

## 2024-12-23 DIAGNOSIS — M21611 Bunion of right foot: Secondary | ICD-10-CM | POA: Diagnosis not present

## 2024-12-23 DIAGNOSIS — Z79899 Other long term (current) drug therapy: Secondary | ICD-10-CM

## 2024-12-23 DIAGNOSIS — Z1211 Encounter for screening for malignant neoplasm of colon: Secondary | ICD-10-CM | POA: Diagnosis not present

## 2024-12-23 DIAGNOSIS — Z125 Encounter for screening for malignant neoplasm of prostate: Secondary | ICD-10-CM

## 2024-12-23 DIAGNOSIS — H9191 Unspecified hearing loss, right ear: Secondary | ICD-10-CM

## 2024-12-23 DIAGNOSIS — I10 Essential (primary) hypertension: Secondary | ICD-10-CM | POA: Diagnosis not present

## 2024-12-23 DIAGNOSIS — Z0000 Encounter for general adult medical examination without abnormal findings: Secondary | ICD-10-CM | POA: Diagnosis not present

## 2024-12-23 DIAGNOSIS — N529 Male erectile dysfunction, unspecified: Secondary | ICD-10-CM

## 2024-12-23 DIAGNOSIS — K2100 Gastro-esophageal reflux disease with esophagitis, without bleeding: Secondary | ICD-10-CM

## 2024-12-23 MED ORDER — OMEPRAZOLE 40 MG PO CPDR: 40.0000 mg | DELAYED_RELEASE_CAPSULE | Freq: Every day | ORAL | 3 refills | Status: AC

## 2024-12-23 MED ORDER — AMLODIPINE BESYLATE 10 MG PO TABS: 10.0000 mg | ORAL_TABLET | Freq: Every day | ORAL | 3 refills | Status: AC

## 2024-12-23 MED ORDER — TADALAFIL 20 MG PO TABS: 10.0000 mg | ORAL_TABLET | ORAL | 12 refills | Status: AC | PRN

## 2024-12-23 NOTE — Progress Notes (Signed)
 "  Annual Wellness Visit     Patient: Christopher Salinas, Male    DOB: 02/18/1961, 64 y.o.   MRN: [id number]  Subjective  Chief Complaint  Patient presents with   Annual Exam    fasting    Christopher Salinas is a 64 y.o. male who presents today for his Annual Wellness Visit. He reports consuming a general diet. Walks 2.5mi daily (5days a week on average). 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 He reports sleeping well. He does not have additional problems to discuss today.   HPI  Vision:Within last year and Dental: No current dental problems and Receives regular dental care  Sees dermatology in March.  Patient Active Problem List   Diagnosis Date Noted   Routine adult health maintenance 12/31/2023   Carpal tunnel syndrome 08/30/2023   Cervicalgia 02/06/2023   Right arm weakness 02/06/2023   Cervical disc disorder at C5-C6 level with radiculopathy 02/06/2023   Routine general medical examination at a health care facility 01/04/2023   Paresthesia of right foot 01/03/2023   Chronic right shoulder pain 08/15/2022   Hyperlipidemia 06/23/2020   Erectile dysfunction 06/23/2020   Hypertension 12/24/2019   Past Medical History:  Diagnosis Date   Bladder cancer [facility])    Renal mass    Past Surgical History:  Procedure Laterality Date   BLADDER TUMOR EXCISION  2019   COLONOSCOPY  2016   HEMORRHOID BANDING  11/2018   OPEN ANTERIOR SHOULDER RECONSTRUCTION Right 1980   Social History[1]    Medications: Show/hide medication list[2]  Allergies[3]  Patient Care Team: Lowella Benton CROME, [place] as PCP - General (Physician Assistant)  ROS Complete 12 point ROS performed with all pertinent positives listed in HPI     Objective  BP 138/80   Pulse 66   Ht 6' 1 (1.854 m)   Wt 224 lb (101.6 kg)   SpO2 100%   BMI 29.55 kg/m  BP Readings from Last 3 Encounters:  12/23/24 138/80  06/10/24 (!) 150/83  01/14/24 (!) 140/88   Wt Readings from Last 3  Encounters:  12/23/24 224 lb (101.6 kg)  06/10/24 226 lb (102.5 kg)  01/14/24 229 lb 0.6 oz (103.9 kg)      Physical Exam Vitals and nursing note reviewed.  Constitutional:      General: He is not in acute distress.    Appearance: Normal appearance. He is not ill-appearing, toxic-appearing or diaphoretic.  HENT:     Head: Normocephalic and atraumatic.     Right Ear: Tympanic membrane, ear canal and external ear normal. There is no impacted cerumen.     Left Ear: Tympanic membrane, ear canal and external ear normal. There is no impacted cerumen.     Nose: Nose normal.     Mouth/Throat:     Mouth: Mucous membranes are moist.     Pharynx: Oropharynx is clear. No oropharyngeal exudate or posterior oropharyngeal erythema.  Eyes:     General: No scleral icterus.       Right eye: No discharge.        Left eye: No discharge.     Extraocular Movements: Extraocular movements intact.     Pupils: Pupils are equal, round, and reactive to light.  Neck:     Thyroid : No thyroid  mass, thyromegaly or thyroid  tenderness.  Cardiovascular:     Rate and Rhythm: Normal rate and regular rhythm.     Pulses: Normal pulses.     Heart sounds: No  murmur heard. Pulmonary:     Effort: Pulmonary effort is normal. No respiratory distress.     Breath sounds: Normal breath sounds. No stridor. No wheezing or rhonchi.  Abdominal:     General: Abdomen is flat. Bowel sounds are normal. There is no distension.     Palpations: Abdomen is soft. There is no mass.     Tenderness: There is no abdominal tenderness. There is no guarding.  Musculoskeletal:     Cervical back: Normal range of motion and neck supple. No rigidity or tenderness.     Right lower leg: No edema.     Left lower leg: No edema.  Lymphadenopathy:     Cervical: No cervical adenopathy.  Skin:    General: Skin is warm and dry.     Coloration: Skin is not jaundiced.     Findings: Lesion (likely AK to midline scalp) present. No bruising, erythema or  rash.  Neurological:     General: No focal deficit present.     Mental Status: He is alert and oriented to person, place, and time.     Sensory: No sensory deficit.     Motor: No weakness.  Psychiatric:        Mood and Affect: Mood normal.        Behavior: Behavior normal.     Most recent depression screenings:    12/23/2024    8:04 AM 01/03/2023    9:05 AM  PHQ 2/9 Scores  PHQ - 2 Score 0 0  PHQ- 9 Score 0 0      Data saved with a previous flowsheet row definition    Fall Screening Falls in the past year?: 0 Number of falls in past year: 0 Was there an injury with Fall?: 0 Fall Risk Category Calculator: 0 Patient Fall Risk Level: Low Fall Risk  Fall Risk Patient at Risk for Falls Due to: No Fall Risks Fall risk Follow up: Falls evaluation completed    Vision/Hearing Screen: No results found.  Last CBC Lab Results  Component Value Date   WBC 6.6 12/31/2023   HGB 15.7 12/31/2023   HCT 46.5 12/31/2023   MCV 89 12/31/2023   MCH 30.2 12/31/2023   RDW 12.3 12/31/2023   PLT 232 12/31/2023   Last metabolic panel Lab Results  Component Value Date   GLUCOSE 115 (H) 12/31/2023   NA 139 12/31/2023   K 4.1 12/31/2023   CL 100 12/31/2023   CO2 21 12/31/2023   BUN 14 12/31/2023   CREATININE 1.01 12/31/2023   EGFR 85 12/31/2023   CALCIUM 9.5 12/31/2023   PROT 7.7 01/03/2023   ALBUMIN 4.7 01/03/2023   BILITOT 0.5 01/03/2023   ALKPHOS 109 01/03/2023   AST 31 01/03/2023   ALT 22 01/03/2023   Last lipids Lab Results  Component Value Date   CHOL 268 (H) 12/31/2023   HDL 42 12/31/2023   LDLCALC 192 (H) 12/31/2023   LDLDIRECT 182.0 12/24/2020   TRIG 179 (H) 12/31/2023   CHOLHDL 6.4 (H) 12/31/2023   Last hemoglobin A1c Lab Results  Component Value Date   HGBA1C 5.5 06/10/2024   Last thyroid  functions Lab Results  Component Value Date   TSH 0.69 01/03/2023   Last vitamin D Lab Results  Component Value Date   VD25OH 26.39 (L) 08/22/2021   Last  vitamin B12 and Folate Lab Results  Component Value Date   VITAMINB12 377 06/10/2024   FOLATE 6.8 06/10/2024      No results found for  any visits on 12/23/24.    Assessment & Plan   Annual wellness visit done today including the all of the following: Reviewed patient's Family and Medical History Reviewed and updated list of patient's medical providers Assessment of cognitive impairment was done Assessed patient's functional ability Established a written schedule for health screening services Health Risk Assessent Completed and Reviewed  Exercise Activities and Dietary recommendations  Goals   None     Immunization History  Administered Date(s) Administered   Hepatitis A, Adult 12/16/2018   Tdap 12/16/2018    Health Maintenance  Topic Date Due   Colonoscopy  12/17/2023   COVID-19 Vaccine (1) 01/08/2025 (Originally 01/17/1962)   Influenza Vaccine  03/10/2025 (Originally 07/11/2024)   Zoster Vaccines- Shingrix (1 of 2) 03/23/2025 (Originally 07/16/1982)   Pneumococcal Vaccine: 50+ Years (1 of 1 - PCV) 12/23/2025 (Originally 07/16/2013)   Hepatitis C Screening  12/23/2025 (Originally 07/16/1981)   HIV Screening  01/04/2027 (Originally 07/16/1978)   DTaP/Tdap/Td (2 - Td or Tdap) 12/16/2028   Hepatitis B Vaccines 19-59 Average Risk  Aged Out   HPV VACCINES  Aged Out   Meningococcal B Vaccine  Aged Out     Discussed health benefits of physical activity, and encouraged him to engage in regular exercise appropriate for his age and condition.    Problem List Items Addressed This Visit       Cardiovascular and Mediastinum   Hypertension   Relevant Medications   amLODipine  (NORVASC ) 10 MG tablet   tadalafil  (CIALIS ) 20 MG tablet   Other Relevant Orders   CBC with Differential/Platelet   TSH   Comprehensive metabolic panel with GFR     Other   Erectile dysfunction   Relevant Medications   tadalafil  (CIALIS ) 20 MG tablet   Routine adult health maintenance - Primary    Relevant Orders   CBC with Differential/Platelet   Hemoglobin A1c   TSH   Lipid panel   Comprehensive metabolic panel with GFR   B12 and Folate Panel   Magnesium   PSA, total and free   Other Visit Diagnoses       Gastroesophageal reflux disease with esophagitis without hemorrhage       Relevant Medications   omeprazole  (PRILOSEC) 40 MG capsule   Other Relevant Orders   Ambulatory referral to Gastroenterology   B12 and Folate Panel   Magnesium     Bunion of right foot       Relevant Orders   Ambulatory referral to Podiatry     Hearing decreased, right         Screen for colon cancer       Relevant Orders   Ambulatory referral to Gastroenterology     Prostate cancer screening       Relevant Orders   PSA, total and free     Long-term use of high-risk medication       Relevant Orders   CBC with Differential/Platelet   Hemoglobin A1c   TSH   Lipid panel   Comprehensive metabolic panel with GFR   B12 and Folate Panel   Magnesium      Annual physical completed today. GI referral placed to update colon screen and possibly update EGD due to GERD Will place referral to podiatry for noted bunion on R foot. Labs updated today and med refills called in.  Return in about 1 year (around 12/23/2025) for Annual Physical.     Benton LITTIE Gave, PA       [1]  Social History Tobacco Use   Smoking status: Former    Current packs/day: 0.00    Types: Cigars, Cigarettes    Quit date: 2003    Years since quitting: 23.0   Smokeless tobacco: Never  Substance Use Topics   Alcohol use: Not Currently   Drug use: Not Currently  [2]  Outpatient Medications Prior to Visit  Medication Sig   Multiple Vitamin (MULTIVITAMIN ADULT PO) Take by mouth daily.   [DISCONTINUED] amLODipine  (NORVASC ) 10 MG tablet Take 1 tablet (10 mg total) by mouth daily.   [DISCONTINUED] omeprazole  (PRILOSEC) 40 MG capsule Take 1 capsule (40 mg total) by mouth daily.   [DISCONTINUED] tadalafil  (CIALIS ) 20  MG tablet Take 0.5-1 tablets (10-20 mg total) by mouth as needed for erectile dysfunction.   No facility-administered medications prior to visit.  [3]  Allergies Allergen Reactions   Ace Inhibitors     Cough with both ace/arb   "

## 2024-12-23 NOTE — Patient Instructions (Signed)
 Continue all your medications as ordered.  Please schedule a GI appointment to update your colon screening. Please also follow up with podiatry.  Return to see me in one year, or 6 months pending lab results

## 2024-12-24 ENCOUNTER — Ambulatory Visit: Payer: Self-pay | Admitting: Urgent Care

## 2024-12-24 LAB — COMPREHENSIVE METABOLIC PANEL WITH GFR
ALT: 25 IU/L (ref 0–44)
AST: 22 IU/L (ref 0–40)
Albumin: 4.9 g/dL (ref 3.9–4.9)
Alkaline Phosphatase: 114 IU/L (ref 47–123)
BUN/Creatinine Ratio: 12 (ref 10–24)
BUN: 12 mg/dL (ref 8–27)
Bilirubin Total: 0.5 mg/dL (ref 0.0–1.2)
CO2: 20 mmol/L (ref 20–29)
Calcium: 9.5 mg/dL (ref 8.6–10.2)
Chloride: 101 mmol/L (ref 96–106)
Creatinine, Ser: 1 mg/dL (ref 0.76–1.27)
Globulin, Total: 2.5 g/dL (ref 1.5–4.5)
Glucose: 113 mg/dL — ABNORMAL HIGH (ref 70–99)
Potassium: 4.5 mmol/L (ref 3.5–5.2)
Sodium: 138 mmol/L (ref 134–144)
Total Protein: 7.4 g/dL (ref 6.0–8.5)
eGFR: 86 mL/min/1.73

## 2024-12-24 LAB — CBC WITH DIFFERENTIAL/PLATELET
Basophils Absolute: 0.1 x10E3/uL (ref 0.0–0.2)
Basos: 1 %
EOS (ABSOLUTE): 0.2 x10E3/uL (ref 0.0–0.4)
Eos: 3 %
Hematocrit: 49.1 % (ref 37.5–51.0)
Hemoglobin: 16 g/dL (ref 13.0–17.7)
Immature Grans (Abs): 0 x10E3/uL (ref 0.0–0.1)
Immature Granulocytes: 0 %
Lymphocytes Absolute: 2.1 x10E3/uL (ref 0.7–3.1)
Lymphs: 30 %
MCH: 28.7 pg (ref 26.6–33.0)
MCHC: 32.6 g/dL (ref 31.5–35.7)
MCV: 88 fL (ref 79–97)
Monocytes Absolute: 0.8 x10E3/uL (ref 0.1–0.9)
Monocytes: 11 %
Neutrophils Absolute: 3.9 x10E3/uL (ref 1.4–7.0)
Neutrophils: 55 %
Platelets: 246 x10E3/uL (ref 150–450)
RBC: 5.57 x10E6/uL (ref 4.14–5.80)
RDW: 12.7 % (ref 11.6–15.4)
WBC: 7 x10E3/uL (ref 3.4–10.8)

## 2024-12-24 LAB — LIPID PANEL
Chol/HDL Ratio: 6 ratio — ABNORMAL HIGH (ref 0.0–5.0)
Cholesterol, Total: 247 mg/dL — ABNORMAL HIGH (ref 100–199)
HDL: 41 mg/dL
LDL Chol Calc (NIH): 176 mg/dL — ABNORMAL HIGH (ref 0–99)
Triglycerides: 163 mg/dL — ABNORMAL HIGH (ref 0–149)
VLDL Cholesterol Cal: 30 mg/dL (ref 5–40)

## 2024-12-24 LAB — B12 AND FOLATE PANEL
Folate: 10.6 ng/mL
Vitamin B-12: 564 pg/mL (ref 232–1245)

## 2024-12-24 LAB — HEMOGLOBIN A1C
Est. average glucose Bld gHb Est-mCnc: 108 mg/dL
Hgb A1c MFr Bld: 5.4 % (ref 4.8–5.6)

## 2024-12-24 LAB — PSA, TOTAL AND FREE
PSA, Free Pct: 25 %
PSA, Free: 0.25 ng/mL
Prostate Specific Ag, Serum: 1 ng/mL (ref 0.0–4.0)

## 2024-12-24 LAB — MAGNESIUM: Magnesium: 2.2 mg/dL (ref 1.6–2.3)

## 2024-12-24 LAB — TSH: TSH: 0.82 u[IU]/mL (ref 0.450–4.500)

## 2025-01-01 ENCOUNTER — Encounter: Payer: Self-pay | Admitting: Podiatry

## 2025-01-01 ENCOUNTER — Ambulatory Visit: Admitting: Podiatry

## 2025-01-01 ENCOUNTER — Telehealth: Payer: Self-pay | Admitting: *Deleted

## 2025-01-01 ENCOUNTER — Ambulatory Visit

## 2025-01-01 DIAGNOSIS — M21611 Bunion of right foot: Secondary | ICD-10-CM | POA: Diagnosis not present

## 2025-01-01 DIAGNOSIS — M2011 Hallux valgus (acquired), right foot: Secondary | ICD-10-CM

## 2025-01-01 DIAGNOSIS — M79671 Pain in right foot: Secondary | ICD-10-CM

## 2025-01-01 NOTE — Progress Notes (Signed)
"  °  Subjective:  Patient ID: Christopher Salinas, male    DOB: 06-23-1961,   MRN: [id number]  Chief Complaint  Patient presents with   Foot Pain    My right foot has a bunion on the side of it.  It's starting to give me a little bit of trouble.  I'd like to do something about it.    64 y.o. male presents for concern of right foot bunion that has been present for years and has recently in the last couple years been bothering him more more.  He tries to wear wide shoes.  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  He does a lot of walking and had recently accidentally had narrower shoes that he thinks may have flared things up.  Here to discuss options.. Denies any other pedal complaints. Denies n/v/f/c.   Past Medical History:  Diagnosis Date   Bladder cancer (HCC)    Renal mass     Objective:  Physical Exam: Vascular: DP/PT pulses 2/4 bilateral. CFT <3 seconds. Normal hair growth on digits. No edema.  Skin. No lacerations or abrasions bilateral feet.  Musculoskeletal: MMT 5/5 bilateral lower extremities in DF, PF, Inversion and Eversion. Deceased ROM in DF of ankle joint.  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  Mild tenderness to medial eminence.  No pain with range of motion of the first MPJ.  No hypermobility of the first ray noted. Neurological: Sensation intact to light touch.   Assessment:   1. Bunion, right      Plan:  Patient was evaluated and treated and all questions answered. -Xrays reviewed no acute fractures or dislocations noted.  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  Status position of 5. -Discussed HAV and treatment options;conservative and surgical management; risks, benefits, alternatives discussed. All patient's questions answered. -Discussed padding and wide shoe gear.   -Recommend continue with good supportive shoes and inserts.  -Discussed surgical options.  Discussed Massie edwards briefly  with patient and perioperative course.  Patient would like to hold off on surgery at this time and see if he can avoid in the future. -Patient to return to office as needed or sooner if condition worsens.    Asberry Failing, DPM    "

## 2025-01-01 NOTE — Telephone Encounter (Signed)
 I called the patient and asked him to stop by imaging on his way in for xrays.  I asked him to enter the building via the back entrance.

## 2025-12-23 ENCOUNTER — Encounter: Admitting: Urgent Care
